# Patient Record
Sex: Female | Born: 1965 | Race: Black or African American | Hispanic: No | Smoking: Never smoker
Health system: Southern US, Community
[De-identification: ages and names within clinical notes are randomized; demographics above are authoritative.]

## PROBLEM LIST (undated history)

## (undated) DIAGNOSIS — S82201B Unspecified fracture of shaft of right tibia, initial encounter for open fracture type I or II: Secondary | ICD-10-CM

## (undated) DIAGNOSIS — R600 Localized edema: Secondary | ICD-10-CM

## (undated) DIAGNOSIS — B20 Human immunodeficiency virus [HIV] disease: Secondary | ICD-10-CM

## (undated) DIAGNOSIS — W3400XA Accidental discharge from unspecified firearms or gun, initial encounter: Secondary | ICD-10-CM

## (undated) DIAGNOSIS — Z21 Asymptomatic human immunodeficiency virus [HIV] infection status: Secondary | ICD-10-CM

## (undated) DIAGNOSIS — M1710 Unilateral primary osteoarthritis, unspecified knee: Secondary | ICD-10-CM

## (undated) DIAGNOSIS — S7400XA Injury of sciatic nerve at hip and thigh level, unspecified leg, initial encounter: Secondary | ICD-10-CM

## (undated) DIAGNOSIS — D649 Anemia, unspecified: Secondary | ICD-10-CM

## (undated) HISTORY — DX: Unilateral primary osteoarthritis, unspecified knee: M17.10

## (undated) HISTORY — DX: Localized edema: R60.0

## (undated) HISTORY — PX: TUBAL LIGATION: SHX77

---

## 1997-06-12 ENCOUNTER — Encounter: Admission: RE | Admit: 1997-06-12 | Discharge: 1997-06-12 | Payer: Self-pay | Admitting: Internal Medicine

## 1997-07-21 ENCOUNTER — Encounter: Admission: RE | Admit: 1997-07-21 | Discharge: 1997-07-21 | Payer: Self-pay | Admitting: Internal Medicine

## 1998-01-11 ENCOUNTER — Encounter: Admission: RE | Admit: 1998-01-11 | Discharge: 1998-01-11 | Payer: Self-pay | Admitting: Internal Medicine

## 1998-02-22 ENCOUNTER — Encounter: Admission: RE | Admit: 1998-02-22 | Discharge: 1998-02-22 | Payer: Self-pay | Admitting: Internal Medicine

## 1998-11-25 ENCOUNTER — Encounter: Payer: Self-pay | Admitting: Obstetrics and Gynecology

## 1998-11-26 ENCOUNTER — Ambulatory Visit (HOSPITAL_COMMUNITY): Admission: RE | Admit: 1998-11-26 | Discharge: 1998-11-26 | Payer: Self-pay | Admitting: Obstetrics and Gynecology

## 2000-08-20 ENCOUNTER — Encounter (INDEPENDENT_AMBULATORY_CARE_PROVIDER_SITE_OTHER): Payer: Self-pay | Admitting: *Deleted

## 2000-08-20 ENCOUNTER — Encounter: Admission: RE | Admit: 2000-08-20 | Discharge: 2000-08-20 | Payer: Self-pay | Admitting: Internal Medicine

## 2000-08-20 ENCOUNTER — Ambulatory Visit (HOSPITAL_COMMUNITY): Admission: RE | Admit: 2000-08-20 | Discharge: 2000-08-20 | Payer: Self-pay | Admitting: Internal Medicine

## 2000-08-20 LAB — CONVERTED CEMR LAB
CD4 Count: 560 microliters
CD4 T Cell Abs: 560

## 2000-09-12 ENCOUNTER — Encounter: Admission: RE | Admit: 2000-09-12 | Discharge: 2000-09-12 | Payer: Self-pay | Admitting: Internal Medicine

## 2001-10-11 ENCOUNTER — Other Ambulatory Visit: Admission: RE | Admit: 2001-10-11 | Discharge: 2001-10-11 | Payer: Self-pay | Admitting: Obstetrics and Gynecology

## 2003-03-03 ENCOUNTER — Encounter (INDEPENDENT_AMBULATORY_CARE_PROVIDER_SITE_OTHER): Payer: Self-pay | Admitting: *Deleted

## 2003-03-03 ENCOUNTER — Encounter: Admission: RE | Admit: 2003-03-03 | Discharge: 2003-03-03 | Payer: Self-pay | Admitting: Internal Medicine

## 2003-03-03 ENCOUNTER — Ambulatory Visit (HOSPITAL_COMMUNITY): Admission: RE | Admit: 2003-03-03 | Discharge: 2003-03-03 | Payer: Self-pay | Admitting: Internal Medicine

## 2003-03-03 LAB — CONVERTED CEMR LAB: HIV 1 RNA Quant: 2190 copies/mL

## 2003-07-08 ENCOUNTER — Encounter: Admission: RE | Admit: 2003-07-08 | Discharge: 2003-07-08 | Payer: Self-pay | Admitting: Internal Medicine

## 2003-07-08 ENCOUNTER — Ambulatory Visit (HOSPITAL_COMMUNITY): Admission: RE | Admit: 2003-07-08 | Discharge: 2003-07-08 | Payer: Self-pay | Admitting: Internal Medicine

## 2003-07-08 ENCOUNTER — Encounter (INDEPENDENT_AMBULATORY_CARE_PROVIDER_SITE_OTHER): Payer: Self-pay | Admitting: *Deleted

## 2003-07-08 LAB — CONVERTED CEMR LAB
CD4 Count: 500 uL
HIV 1 RNA Quant: 487 {copies}/mL

## 2003-07-22 ENCOUNTER — Encounter: Admission: RE | Admit: 2003-07-22 | Discharge: 2003-07-22 | Payer: Self-pay | Admitting: Internal Medicine

## 2004-12-14 ENCOUNTER — Emergency Department (HOSPITAL_COMMUNITY): Admission: EM | Admit: 2004-12-14 | Discharge: 2004-12-14 | Payer: Self-pay | Admitting: Family Medicine

## 2005-06-13 ENCOUNTER — Encounter (INDEPENDENT_AMBULATORY_CARE_PROVIDER_SITE_OTHER): Payer: Self-pay | Admitting: *Deleted

## 2005-06-13 ENCOUNTER — Ambulatory Visit: Payer: Self-pay | Admitting: Internal Medicine

## 2005-06-13 ENCOUNTER — Encounter: Admission: RE | Admit: 2005-06-13 | Discharge: 2005-06-13 | Payer: Self-pay | Admitting: Internal Medicine

## 2005-06-13 LAB — CONVERTED CEMR LAB
CD4 Count: 660 microliters
HIV 1 RNA Quant: 1760 copies/mL

## 2005-06-27 ENCOUNTER — Ambulatory Visit: Payer: Self-pay | Admitting: Internal Medicine

## 2006-03-05 ENCOUNTER — Encounter (INDEPENDENT_AMBULATORY_CARE_PROVIDER_SITE_OTHER): Payer: Self-pay | Admitting: *Deleted

## 2006-03-05 LAB — CONVERTED CEMR LAB: Pap Smear: NORMAL

## 2006-03-18 ENCOUNTER — Encounter (INDEPENDENT_AMBULATORY_CARE_PROVIDER_SITE_OTHER): Payer: Self-pay | Admitting: *Deleted

## 2007-03-04 ENCOUNTER — Encounter: Payer: Self-pay | Admitting: Internal Medicine

## 2007-03-18 ENCOUNTER — Ambulatory Visit: Payer: Self-pay | Admitting: Internal Medicine

## 2007-03-18 ENCOUNTER — Encounter: Admission: RE | Admit: 2007-03-18 | Discharge: 2007-03-18 | Payer: Self-pay | Admitting: Internal Medicine

## 2007-03-18 LAB — CONVERTED CEMR LAB
ALT: 12 units/L (ref 0–35)
AST: 13 units/L (ref 0–37)
Albumin: 3.9 g/dL (ref 3.5–5.2)
Alkaline Phosphatase: 46 units/L (ref 39–117)
BUN: 12 mg/dL (ref 6–23)
Basophils Absolute: 0 10*3/uL (ref 0.0–0.1)
Basophils Relative: 0 % (ref 0–1)
CO2: 22 meq/L (ref 19–32)
Calcium: 8.9 mg/dL (ref 8.4–10.5)
Chloride: 103 meq/L (ref 96–112)
Cholesterol: 169 mg/dL (ref 0–200)
Creatinine, Ser: 0.9 mg/dL (ref 0.40–1.20)
Eosinophils Absolute: 0.1 10*3/uL (ref 0.0–0.7)
Eosinophils Relative: 1 % (ref 0–5)
Glucose, Bld: 92 mg/dL (ref 70–99)
HCT: 38.1 % (ref 36.0–46.0)
HDL: 39 mg/dL — ABNORMAL LOW (ref >39)
HIV 1 RNA Quant: 719 copies/mL — ABNORMAL HIGH (ref <50)
HIV-1 RNA Quant, Log: 2.86 — ABNORMAL HIGH (ref <1.70)
Hemoglobin: 11.7 g/dL — ABNORMAL LOW (ref 12.0–15.0)
LDL Cholesterol: 107 mg/dL — ABNORMAL HIGH (ref 0–99)
Lymphocytes Relative: 57 % — ABNORMAL HIGH (ref 12–46)
Lymphs Abs: 3 10*3/uL (ref 0.7–4.0)
MCHC: 30.7 g/dL (ref 30.0–36.0)
MCV: 92.3 fL (ref 78.0–100.0)
Monocytes Absolute: 0.4 10*3/uL (ref 0.1–1.0)
Monocytes Relative: 8 % (ref 3–12)
Neutro Abs: 1.8 10*3/uL (ref 1.7–7.7)
Neutrophils Relative %: 34 % — ABNORMAL LOW (ref 43–77)
Platelets: 289 10*3/uL (ref 150–400)
Potassium: 4.7 meq/L (ref 3.5–5.3)
RBC: 4.13 M/uL (ref 3.87–5.11)
RDW: 14.1 % (ref 11.5–15.5)
Sodium: 140 meq/L (ref 135–145)
Total Bilirubin: 0.3 mg/dL (ref 0.3–1.2)
Total CHOL/HDL Ratio: 4.3
Total Protein: 7.4 g/dL (ref 6.0–8.3)
Triglycerides: 116 mg/dL (ref <150)
VLDL: 23 mg/dL (ref 0–40)
WBC: 5.2 10*3/uL (ref 4.0–10.5)

## 2007-03-27 ENCOUNTER — Ambulatory Visit: Payer: Self-pay | Admitting: Internal Medicine

## 2007-03-27 DIAGNOSIS — B20 Human immunodeficiency virus [HIV] disease: Secondary | ICD-10-CM | POA: Insufficient documentation

## 2007-06-17 ENCOUNTER — Telehealth (INDEPENDENT_AMBULATORY_CARE_PROVIDER_SITE_OTHER): Payer: Self-pay | Admitting: *Deleted

## 2010-07-06 ENCOUNTER — Inpatient Hospital Stay (INDEPENDENT_AMBULATORY_CARE_PROVIDER_SITE_OTHER)
Admission: RE | Admit: 2010-07-06 | Discharge: 2010-07-06 | Disposition: A | Discharge status: Home / Self Care | Payer: BC Managed Care – PPO | Source: Ambulatory Visit | Attending: Family Medicine | Admitting: Family Medicine

## 2010-07-06 ENCOUNTER — Ambulatory Visit (INDEPENDENT_AMBULATORY_CARE_PROVIDER_SITE_OTHER): Payer: BC Managed Care – PPO

## 2010-07-06 DIAGNOSIS — R6889 Other general symptoms and signs: Secondary | ICD-10-CM

## 2011-09-06 ENCOUNTER — Emergency Department (HOSPITAL_COMMUNITY): Payer: Worker's Compensation

## 2011-09-06 ENCOUNTER — Inpatient Hospital Stay (HOSPITAL_COMMUNITY)
Admission: EM | Admit: 2011-09-06 | Discharge: 2011-09-11 | DRG: 579 | Disposition: A | Discharge status: Rehabilitation Facility | Payer: Worker's Compensation | Attending: Orthopedic Surgery | Admitting: Orthopedic Surgery

## 2011-09-06 ENCOUNTER — Other Ambulatory Visit: Payer: Self-pay

## 2011-09-06 ENCOUNTER — Encounter (HOSPITAL_COMMUNITY): Payer: Self-pay | Admitting: *Deleted

## 2011-09-06 ENCOUNTER — Emergency Department (HOSPITAL_COMMUNITY): Payer: Worker's Compensation | Admitting: *Deleted

## 2011-09-06 ENCOUNTER — Encounter (HOSPITAL_COMMUNITY)
Admission: EM | Disposition: A | Discharge status: Rehabilitation Facility | Payer: Self-pay | Source: Home / Self Care | Attending: Orthopedic Surgery

## 2011-09-06 DIAGNOSIS — M216X9 Other acquired deformities of unspecified foot: Secondary | ICD-10-CM | POA: Diagnosis present

## 2011-09-06 DIAGNOSIS — W3400XA Accidental discharge from unspecified firearms or gun, initial encounter: Secondary | ICD-10-CM

## 2011-09-06 DIAGNOSIS — S82209B Unspecified fracture of shaft of unspecified tibia, initial encounter for open fracture type I or II: Secondary | ICD-10-CM | POA: Diagnosis present

## 2011-09-06 DIAGNOSIS — S82209A Unspecified fracture of shaft of unspecified tibia, initial encounter for closed fracture: Secondary | ICD-10-CM

## 2011-09-06 DIAGNOSIS — R599 Enlarged lymph nodes, unspecified: Secondary | ICD-10-CM | POA: Diagnosis present

## 2011-09-06 DIAGNOSIS — Y990 Civilian activity done for income or pay: Secondary | ICD-10-CM

## 2011-09-06 DIAGNOSIS — S81009A Unspecified open wound, unspecified knee, initial encounter: Secondary | ICD-10-CM | POA: Diagnosis present

## 2011-09-06 DIAGNOSIS — S82201B Unspecified fracture of shaft of right tibia, initial encounter for open fracture type I or II: Secondary | ICD-10-CM | POA: Diagnosis present

## 2011-09-06 DIAGNOSIS — S71009A Unspecified open wound, unspecified hip, initial encounter: Principal | ICD-10-CM | POA: Diagnosis present

## 2011-09-06 DIAGNOSIS — S71109A Unspecified open wound, unspecified thigh, initial encounter: Principal | ICD-10-CM | POA: Diagnosis present

## 2011-09-06 DIAGNOSIS — S82409A Unspecified fracture of shaft of unspecified fibula, initial encounter for closed fracture: Secondary | ICD-10-CM

## 2011-09-06 DIAGNOSIS — S7400XA Injury of sciatic nerve at hip and thigh level, unspecified leg, initial encounter: Secondary | ICD-10-CM | POA: Diagnosis present

## 2011-09-06 DIAGNOSIS — Y9229 Other specified public building as the place of occurrence of the external cause: Secondary | ICD-10-CM

## 2011-09-06 DIAGNOSIS — D649 Anemia, unspecified: Secondary | ICD-10-CM | POA: Diagnosis present

## 2011-09-06 DIAGNOSIS — E669 Obesity, unspecified: Secondary | ICD-10-CM | POA: Diagnosis present

## 2011-09-06 HISTORY — PX: OTHER SURGICAL HISTORY: SHX169

## 2011-09-06 HISTORY — DX: Injury of sciatic nerve at hip and thigh level, unspecified leg, initial encounter: S74.00XA

## 2011-09-06 HISTORY — PX: TIBIA IM NAIL INSERTION: SHX2516

## 2011-09-06 HISTORY — DX: Accidental discharge from unspecified firearms or gun, initial encounter: W34.00XA

## 2011-09-06 HISTORY — PX: I & D EXTREMITY: SHX5045

## 2011-09-06 HISTORY — DX: Anemia, unspecified: D64.9

## 2011-09-06 HISTORY — DX: Unspecified fracture of shaft of right tibia, initial encounter for open fracture type I or II: S82.201B

## 2011-09-06 LAB — POCT I-STAT, CHEM 8
BUN: 12 mg/dL (ref 6–23)
Creatinine, Ser: 1 mg/dL (ref 0.50–1.10)
Glucose, Bld: 144 mg/dL — ABNORMAL HIGH (ref 70–99)
Hemoglobin: 10.2 g/dL — ABNORMAL LOW (ref 12.0–15.0)
Potassium: 3.5 mEq/L (ref 3.5–5.1)
Sodium: 138 mEq/L (ref 135–145)
TCO2: 20 mmol/L (ref 0–100)

## 2011-09-06 LAB — CBC
HCT: 28.1 % — ABNORMAL LOW (ref 36.0–46.0)
Hemoglobin: 8.6 g/dL — ABNORMAL LOW (ref 12.0–15.0)
MCV: 77.2 fL — ABNORMAL LOW (ref 78.0–100.0)
RBC: 3.64 MIL/uL — ABNORMAL LOW (ref 3.87–5.11)
WBC: 8.1 10*3/uL (ref 4.0–10.5)

## 2011-09-06 LAB — COMPREHENSIVE METABOLIC PANEL
Albumin: 3.5 g/dL (ref 3.5–5.2)
BUN: 12 mg/dL (ref 6–23)
Calcium: 9.2 mg/dL (ref 8.4–10.5)
Creatinine, Ser: 0.88 mg/dL (ref 0.50–1.10)
GFR calc Af Amer: >90 mL/min (ref >90)
Glucose, Bld: 145 mg/dL — ABNORMAL HIGH (ref 70–99)
Potassium: 3.5 mEq/L (ref 3.5–5.1)
Total Protein: 8.3 g/dL (ref 6.0–8.3)

## 2011-09-06 LAB — LACTIC ACID, PLASMA: Lactic Acid, Venous: 2.7 mmol/L — ABNORMAL HIGH (ref 0.5–2.2)

## 2011-09-06 SURGERY — INSERTION, INTRAMEDULLARY ROD, TIBIA
Anesthesia: General | Site: Leg Lower | Laterality: Right | Wound class: Contaminated

## 2011-09-06 MED ORDER — PROPOFOL 10 MG/ML IV BOLUS
INTRAVENOUS | Status: DC | PRN
  Administered 2011-09-06: 170 mg via INTRAVENOUS

## 2011-09-06 MED ORDER — SUCCINYLCHOLINE CHLORIDE 20 MG/ML IJ SOLN
INTRAMUSCULAR | Status: DC | PRN
  Administered 2011-09-06: 120 mg via INTRAVENOUS

## 2011-09-06 MED ORDER — LIDOCAINE HCL (CARDIAC) 20 MG/ML IV SOLN
INTRAVENOUS | Status: DC | PRN
  Administered 2011-09-06: 60 mg via INTRAVENOUS

## 2011-09-06 MED ORDER — 0.9 % SODIUM CHLORIDE (POUR BTL) OPTIME
TOPICAL | Status: DC | PRN
  Administered 2011-09-06 (×2): 1000 mL

## 2011-09-06 MED ORDER — FENTANYL CITRATE 0.05 MG/ML IJ SOLN
INTRAMUSCULAR | Status: DC | PRN
  Administered 2011-09-06: 150 ug via INTRAVENOUS

## 2011-09-06 MED ORDER — FENTANYL CITRATE 0.05 MG/ML IJ SOLN
50.0000 ug | Freq: Once | INTRAMUSCULAR | Status: AC
  Administered 2011-09-06: 50 ug via INTRAVENOUS
  Filled 2011-09-06: qty 2

## 2011-09-06 MED ORDER — SODIUM CHLORIDE 0.9 % IR SOLN
Status: DC | PRN
  Administered 2011-09-06: 3000 mL

## 2011-09-06 MED ORDER — SODIUM CHLORIDE 0.9 % IV SOLN
INTRAVENOUS | Status: DC | PRN
  Administered 2011-09-06: 22:00:00 via INTRAVENOUS

## 2011-09-06 MED ORDER — ROCURONIUM BROMIDE 100 MG/10ML IV SOLN
INTRAVENOUS | Status: DC | PRN
  Administered 2011-09-06: 20 mg via INTRAVENOUS

## 2011-09-06 MED ORDER — MIDAZOLAM HCL 5 MG/5ML IJ SOLN
INTRAMUSCULAR | Status: DC | PRN
  Administered 2011-09-06: 2 mg via INTRAVENOUS

## 2011-09-06 MED ORDER — TETANUS-DIPHTH-ACELL PERTUSSIS 5-2.5-18.5 LF-MCG/0.5 IM SUSP
0.5000 mL | Freq: Once | INTRAMUSCULAR | Status: AC
  Administered 2011-09-06: 0.5 mL via INTRAMUSCULAR
  Filled 2011-09-06: qty 0.5

## 2011-09-06 MED ORDER — LACTATED RINGERS IV SOLN
INTRAVENOUS | Status: DC | PRN
  Administered 2011-09-06: 22:00:00 via INTRAVENOUS

## 2011-09-06 MED ORDER — SUFENTANIL CITRATE 50 MCG/ML IV SOLN
INTRAVENOUS | Status: DC | PRN
  Administered 2011-09-06 (×2): 10 ug via INTRAVENOUS
  Administered 2011-09-06: 20 ug via INTRAVENOUS
  Administered 2011-09-06: 10 ug via INTRAVENOUS

## 2011-09-06 MED ORDER — CEFAZOLIN SODIUM-DEXTROSE 2-3 GM-% IV SOLR
2.0000 g | Freq: Once | INTRAVENOUS | Status: AC
  Administered 2011-09-06: 2 g via INTRAVENOUS
  Filled 2011-09-06: qty 50

## 2011-09-06 SURGICAL SUPPLY — 82 items
BANDAGE ELASTIC 4 VELCRO ST LF (GAUZE/BANDAGES/DRESSINGS) ×3 IMPLANT
BANDAGE ELASTIC 6 VELCRO ST LF (GAUZE/BANDAGES/DRESSINGS) ×2 IMPLANT
BANDAGE GAUZE ELAST BULKY 4 IN (GAUZE/BANDAGES/DRESSINGS) ×4 IMPLANT
BIT DRILL 3.8X6 NS (BIT) ×1 IMPLANT
BIT DRILL 4.4 NS (BIT) ×1 IMPLANT
BLADE SURG 10 STRL SS (BLADE) ×2 IMPLANT
BNDG COHESIVE 4X5 TAN STRL (GAUZE/BANDAGES/DRESSINGS) ×1 IMPLANT
BNDG GAUZE STRTCH 6 (GAUZE/BANDAGES/DRESSINGS) ×3 IMPLANT
BRUSH SCRUB DISP (MISCELLANEOUS) ×3 IMPLANT
CLOTH BEACON ORANGE TIMEOUT ST (SAFETY) ×2 IMPLANT
CONT SPECI 4OZ STER CLIK (MISCELLANEOUS) ×1 IMPLANT
COVER MAYO STAND STRL (DRAPES) ×1 IMPLANT
COVER SURGICAL LIGHT HANDLE (MISCELLANEOUS) ×3 IMPLANT
DRAPE C-ARM 42X72 X-RAY (DRAPES) ×2 IMPLANT
DRAPE C-ARMOR (DRAPES) ×3 IMPLANT
DRAPE INCISE IOBAN 66X45 STRL (DRAPES) ×1 IMPLANT
DRAPE ORTHO SPLIT 77X108 STRL (DRAPES) ×4
DRAPE SURG ORHT 6 SPLT 77X108 (DRAPES) ×2 IMPLANT
DRAPE U-SHAPE 47X51 STRL (DRAPES) ×2 IMPLANT
DRSG ADAPTIC 3X8 NADH LF (GAUZE/BANDAGES/DRESSINGS) ×1 IMPLANT
DRSG MEPILEX BORDER 4X12 (GAUZE/BANDAGES/DRESSINGS) ×1 IMPLANT
DRSG MEPILEX BORDER 4X4 (GAUZE/BANDAGES/DRESSINGS) ×4 IMPLANT
DRSG PAD ABDOMINAL 8X10 ST (GAUZE/BANDAGES/DRESSINGS) ×2 IMPLANT
ELECT CAUTERY BLADE 6.4 (BLADE) IMPLANT
ELECT REM PT RETURN 9FT ADLT (ELECTROSURGICAL) ×2
ELECTRODE REM PT RTRN 9FT ADLT (ELECTROSURGICAL) ×1 IMPLANT
EVACUATOR 1/8 PVC DRAIN (DRAIN) IMPLANT
GLOVE BIO SURGEON STRL SZ 6.5 (GLOVE) ×1 IMPLANT
GLOVE BIO SURGEON STRL SZ7 (GLOVE) ×2 IMPLANT
GLOVE BIO SURGEON STRL SZ7.5 (GLOVE) ×2 IMPLANT
GLOVE BIO SURGEON STRL SZ8 (GLOVE) ×2 IMPLANT
GLOVE BIO SURGEON STRL SZ8.5 (GLOVE) ×1 IMPLANT
GLOVE BIOGEL PI IND STRL 7.0 (GLOVE) IMPLANT
GLOVE BIOGEL PI IND STRL 7.5 (GLOVE) ×1 IMPLANT
GLOVE BIOGEL PI IND STRL 8 (GLOVE) ×1 IMPLANT
GLOVE BIOGEL PI IND STRL 8.5 (GLOVE) IMPLANT
GLOVE BIOGEL PI INDICATOR 7.0 (GLOVE) ×2
GLOVE BIOGEL PI INDICATOR 7.5 (GLOVE) ×3
GLOVE BIOGEL PI INDICATOR 8 (GLOVE) ×1
GLOVE BIOGEL PI INDICATOR 8.5 (GLOVE) ×1
GLOVE SURG SS PI 7.5 STRL IVOR (GLOVE) ×1 IMPLANT
GOWN PREVENTION PLUS XLARGE (GOWN DISPOSABLE) ×3 IMPLANT
GOWN STRL NON-REIN LRG LVL3 (GOWN DISPOSABLE) ×6 IMPLANT
GUIDEWIRE BALL NOSE 80CM (WIRE) ×1 IMPLANT
HANDPIECE INTERPULSE COAX TIP (DISPOSABLE) ×2
KIT BASIN OR (CUSTOM PROCEDURE TRAY) ×2 IMPLANT
KIT ROOM TURNOVER OR (KITS) ×2 IMPLANT
MANIFOLD NEPTUNE II (INSTRUMENTS) ×2 IMPLANT
NAIL IM TIBIAL 10X34.5 (Orthopedic Implant) IMPLANT
NS IRRIG 1000ML POUR BTL (IV SOLUTION) ×3 IMPLANT
PACK GENERAL/GYN (CUSTOM PROCEDURE TRAY) ×1 IMPLANT
PACK ORTHO EXTREMITY (CUSTOM PROCEDURE TRAY) ×2 IMPLANT
PAD ARMBOARD 7.5X6 YLW CONV (MISCELLANEOUS) ×4 IMPLANT
PADDING CAST COTTON 6X4 STRL (CAST SUPPLIES) ×1 IMPLANT
SCREW ACECAP 34MM (Screw) ×1 IMPLANT
SCREW ACECAP 44MM (Screw) ×1 IMPLANT
SCREW PROXIMAL DEPUY (Screw) ×4 IMPLANT
SCREW PRXML FT 50X5.5XLCK NS (Screw) IMPLANT
SCREW PRXML FT 65X5.5XNS CORT (Screw) IMPLANT
SET HNDPC FAN SPRY TIP SCT (DISPOSABLE) IMPLANT
SPONGE GAUZE 4X4 12PLY (GAUZE/BANDAGES/DRESSINGS) ×1 IMPLANT
SPONGE LAP 18X18 X RAY DECT (DISPOSABLE) ×3 IMPLANT
STAPLER VISISTAT 35W (STAPLE) ×1 IMPLANT
STOCKINETTE IMPERVIOUS 9X36 MD (GAUZE/BANDAGES/DRESSINGS) ×1 IMPLANT
STRIP CLOSURE SKIN 1/2X4 (GAUZE/BANDAGES/DRESSINGS) ×1 IMPLANT
SUT ETHILON 3 0 PS 1 (SUTURE) ×3 IMPLANT
SUT PDS AB 2-0 CT1 27 (SUTURE) ×2 IMPLANT
SUT PROLENE 3 0 PS 2 (SUTURE) IMPLANT
SUT VIC AB 0 CT1 27 (SUTURE) ×2
SUT VIC AB 0 CT1 27XBRD ANBCTR (SUTURE) IMPLANT
SUT VIC AB 2-0 CT1 27 (SUTURE) ×4
SUT VIC AB 2-0 CT1 TAPERPNT 27 (SUTURE) IMPLANT
SUT VIC AB 2-0 CT3 27 (SUTURE) ×1 IMPLANT
TIBIAL NAIL 10X34.5 (Orthopedic Implant) ×2 IMPLANT
TOWEL OR 17X24 6PK STRL BLUE (TOWEL DISPOSABLE) ×1 IMPLANT
TOWEL OR 17X26 10 PK STRL BLUE (TOWEL DISPOSABLE) ×4 IMPLANT
TRAY FOLEY CATH 14FR (SET/KITS/TRAYS/PACK) ×2 IMPLANT
TUBE ANAEROBIC SPECIMEN COL (MISCELLANEOUS) IMPLANT
TUBE CONNECTING 12X1/4 (SUCTIONS) ×2 IMPLANT
UNDERPAD 30X30 INCONTINENT (UNDERPADS AND DIAPERS) ×3 IMPLANT
WATER STERILE IRR 1000ML POUR (IV SOLUTION) ×1 IMPLANT
YANKAUER SUCT BULB TIP NO VENT (SUCTIONS) ×2 IMPLANT
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
## 2011-09-06 NOTE — ED Notes (Signed)
Report called to OR RN - pt transported to OR via stretcher per tech
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
## 2011-09-06 NOTE — Anesthesia Preprocedure Evaluation (Addendum)
Anesthesia Evaluation  Patient identified by MRN, date of birth, ID band Patient awake    Reviewed: Allergy & Precautions, H&P , NPO status , Patient's Chart, lab work & pertinent test results, reviewed documented beta blocker date and time   Airway Mallampati: I TM Distance: >3 FB Neck ROM: Full    Dental  (+) Teeth Intact and Dental Advisory Given   Pulmonary neg pulmonary ROS,  breath sounds clear to auscultation        Cardiovascular negative cardio ROS  Rhythm:Regular Rate:Normal     Neuro/Psych    GI/Hepatic negative GI ROS, Neg liver ROS,   Endo/Other  negative endocrine ROS  Renal/GU negative Renal ROS     Musculoskeletal   Abdominal   Peds  Hematology negative hematology ROS (+)   Anesthesia Other Findings   Reproductive/Obstetrics                         Anesthesia Physical Anesthesia Plan  ASA: II and Emergent  Anesthesia Plan: General   Post-op Pain Management:    Induction: Rapid sequence and Intravenous  Airway Management Planned: Oral ETT  Additional Equipment:   Intra-op Plan:   Post-operative Plan: Possible Post-op intubation/ventilation  Informed Consent: I have reviewed the patients History and Physical, chart, labs and discussed the procedure including the risks, benefits and alternatives for the proposed anesthesia with the patient or authorized representative who has indicated his/her understanding and acceptance.   Dental advisory given  Plan Discussed with: CRNA, Anesthesiologist and Surgeon  Anesthesia Plan Comments:       Anesthesia Quick Evaluation

## 2011-09-06 NOTE — Progress Notes (Signed)
Chaplain responded to trauma page at 7:15 pm.  Patient asked that her aunt be called, Fulton Mole  [phone]. Patient gave permission for me to tell Aunt of her injury. Chaplain called aunt and reported information.  Within 20 minutes, aunt, uncle and other family arrived in waiting room.  Chaplain escorted them to trauma room area and RN allowed them to go into pt. Room.  After visiting, family members went to consultation room where chaplain offered comfort measures and spiritual conversation.  Before surgery, family returned to pt. Room where uncle led in prayer, then chaplain escorted family to Surgical waiting room.  Will follow.  Rev. [place], [place] [phone]

## 2011-09-07 ENCOUNTER — Emergency Department (HOSPITAL_COMMUNITY): Payer: Worker's Compensation

## 2011-09-07 ENCOUNTER — Inpatient Hospital Stay (HOSPITAL_COMMUNITY): Payer: Worker's Compensation

## 2011-09-07 ENCOUNTER — Encounter (HOSPITAL_COMMUNITY): Payer: Self-pay | Admitting: General Practice

## 2011-09-07 DIAGNOSIS — S7400XA Injury of sciatic nerve at hip and thigh level, unspecified leg, initial encounter: Secondary | ICD-10-CM

## 2011-09-07 DIAGNOSIS — D649 Anemia, unspecified: Secondary | ICD-10-CM

## 2011-09-07 DIAGNOSIS — W3400XA Accidental discharge from unspecified firearms or gun, initial encounter: Secondary | ICD-10-CM

## 2011-09-07 DIAGNOSIS — S82201B Unspecified fracture of shaft of right tibia, initial encounter for open fracture type I or II: Secondary | ICD-10-CM | POA: Diagnosis present

## 2011-09-07 HISTORY — DX: Accidental discharge from unspecified firearms or gun, initial encounter: W34.00XA

## 2011-09-07 HISTORY — DX: Anemia, unspecified: D64.9

## 2011-09-07 HISTORY — DX: Injury of sciatic nerve at hip and thigh level, unspecified leg, initial encounter: S74.00XA

## 2011-09-07 HISTORY — DX: Unspecified fracture of shaft of right tibia, initial encounter for open fracture type I or II: S82.201B

## 2011-09-07 LAB — CBC
HCT: 32.1 % — ABNORMAL LOW (ref 36.0–46.0)
Hemoglobin: 10 g/dL — ABNORMAL LOW (ref 12.0–15.0)
MCH: 24.7 pg — ABNORMAL LOW (ref 26.0–34.0)
MCH: 24.5 pg — ABNORMAL LOW (ref 26.0–34.0)
MCHC: 31.2 g/dL (ref 30.0–36.0)
MCHC: 31 g/dL (ref 30.0–36.0)
MCV: 79.3 fL (ref 78.0–100.0)
MCV: 79.1 fL (ref 78.0–100.0)
Platelets: 275 10*3/uL (ref 150–400)
RBC: 3.59 MIL/uL — ABNORMAL LOW (ref 3.87–5.11)
RDW: 17.1 % — ABNORMAL HIGH (ref 11.5–15.5)

## 2011-09-07 LAB — URINALYSIS, MICROSCOPIC ONLY
Bilirubin Urine: NEGATIVE
Specific Gravity, Urine: 1.015 (ref 1.005–1.030)
Urobilinogen, UA: 0.2 mg/dL (ref 0.0–1.0)
pH: 5.5 (ref 5.0–8.0)

## 2011-09-07 LAB — BASIC METABOLIC PANEL
CO2: 24 mEq/L (ref 19–32)
Calcium: 8 mg/dL — ABNORMAL LOW (ref 8.4–10.5)
Creatinine, Ser: 0.77 mg/dL (ref 0.50–1.10)
GFR calc non Af Amer: >90 mL/min (ref >90)
Sodium: 135 mEq/L (ref 135–145)

## 2011-09-07 MED ORDER — CEFAZOLIN SODIUM 1-5 GM-% IV SOLN
1.0000 g | Freq: Three times a day (TID) | INTRAVENOUS | Status: AC
  Administered 2011-09-07 (×3): 1 g via INTRAVENOUS
  Filled 2011-09-07 (×4): qty 50

## 2011-09-07 MED ORDER — HYDROMORPHONE 0.3 MG/ML IV SOLN
INTRAVENOUS | Status: DC
  Administered 2011-09-07: 3 mg via INTRAVENOUS
  Administered 2011-09-07: 3.2 mg via INTRAVENOUS
  Administered 2011-09-07: 0.9 mg via INTRAVENOUS
  Administered 2011-09-07: 0.3 mg via INTRAVENOUS
  Administered 2011-09-07: 1.39 mg via INTRAVENOUS
  Administered 2011-09-07: 25 mL via INTRAVENOUS
  Administered 2011-09-08: 1.2 mg via INTRAVENOUS
  Administered 2011-09-08: 0.9 mg via INTRAVENOUS
  Administered 2011-09-08: 06:00:00 via INTRAVENOUS
  Filled 2011-09-07 (×3): qty 25

## 2011-09-07 MED ORDER — DIPHENHYDRAMINE HCL 12.5 MG/5ML PO ELIX: 12.5000 mg | ORAL_SOLUTION | Freq: Four times a day (QID) | ORAL | Status: DC | PRN

## 2011-09-07 MED ORDER — OXYCODONE HCL 5 MG PO TABS
5.0000 mg | ORAL_TABLET | ORAL | Status: DC | PRN
  Administered 2011-09-08 – 2011-09-11 (×8): 10 mg via ORAL
  Filled 2011-09-07 (×8): qty 2

## 2011-09-07 MED ORDER — HYDROMORPHONE HCL PF 1 MG/ML IJ SOLN
0.2500 mg | INTRAMUSCULAR | Status: DC | PRN
  Administered 2011-09-07 (×2): 0.5 mg via INTRAVENOUS

## 2011-09-07 MED ORDER — FERROUS SULFATE 325 (65 FE) MG PO TABS
325.0000 mg | ORAL_TABLET | Freq: Three times a day (TID) | ORAL | Status: DC
  Administered 2011-09-07 – 2011-09-11 (×13): 325 mg via ORAL
  Filled 2011-09-07 (×16): qty 1

## 2011-09-07 MED ORDER — DOCUSATE SODIUM 100 MG PO CAPS
100.0000 mg | ORAL_CAPSULE | Freq: Two times a day (BID) | ORAL | Status: DC
  Administered 2011-09-07 – 2011-09-11 (×9): 100 mg via ORAL
  Filled 2011-09-07 (×12): qty 1

## 2011-09-07 MED ORDER — METOCLOPRAMIDE HCL 5 MG/ML IJ SOLN: 5.0000 mg | Freq: Three times a day (TID) | INTRAMUSCULAR | Status: DC | PRN

## 2011-09-07 MED ORDER — ONDANSETRON HCL 4 MG/2ML IJ SOLN
INTRAMUSCULAR | Status: DC | PRN
  Administered 2011-09-07: 4 mg via INTRAVENOUS

## 2011-09-07 MED ORDER — NEOSTIGMINE METHYLSULFATE 1 MG/ML IJ SOLN
INTRAMUSCULAR | Status: DC | PRN
  Administered 2011-09-07: 3 mg via INTRAVENOUS

## 2011-09-07 MED ORDER — HYDROMORPHONE HCL PF 1 MG/ML IJ SOLN
INTRAMUSCULAR | Status: AC
  Filled 2011-09-07: qty 1

## 2011-09-07 MED ORDER — METOCLOPRAMIDE HCL 10 MG PO TABS: 5.0000 mg | ORAL_TABLET | Freq: Three times a day (TID) | ORAL | Status: DC | PRN

## 2011-09-07 MED ORDER — POTASSIUM CHLORIDE IN NACL 20-0.9 MEQ/L-% IV SOLN
INTRAVENOUS | Status: DC
  Administered 2011-09-07 – 2011-09-08 (×2): via INTRAVENOUS
  Filled 2011-09-07 (×2): qty 1000

## 2011-09-07 MED ORDER — TEMAZEPAM 15 MG PO CAPS: 15.0000 mg | ORAL_CAPSULE | Freq: Every evening | ORAL | Status: DC | PRN

## 2011-09-07 MED ORDER — HYDROMORPHONE 0.3 MG/ML IV SOLN
INTRAVENOUS | Status: AC
  Filled 2011-09-07: qty 25

## 2011-09-07 MED ORDER — GLYCOPYRROLATE 0.2 MG/ML IJ SOLN
INTRAMUSCULAR | Status: DC | PRN
  Administered 2011-09-07: 0.4 mg via INTRAVENOUS

## 2011-09-07 MED ORDER — DIPHENHYDRAMINE HCL 12.5 MG/5ML PO ELIX: 12.5000 mg | ORAL_SOLUTION | ORAL | Status: DC | PRN

## 2011-09-07 MED ORDER — POLYETHYLENE GLYCOL 3350 17 G PO PACK
17.0000 g | PACK | Freq: Every day | ORAL | Status: DC
  Administered 2011-09-07 – 2011-09-10 (×4): 17 g via ORAL
  Filled 2011-09-07 (×5): qty 1

## 2011-09-07 MED ORDER — METHOCARBAMOL 500 MG PO TABS
1000.0000 mg | ORAL_TABLET | Freq: Four times a day (QID) | ORAL | Status: DC
  Administered 2011-09-07: 1000 mg via ORAL
  Administered 2011-09-07: 500 mg via ORAL
  Administered 2011-09-07 – 2011-09-08 (×2): 1000 mg via ORAL
  Administered 2011-09-08: 500 mg via ORAL
  Administered 2011-09-08 (×2): 1000 mg via ORAL
  Administered 2011-09-09 (×3): 500 mg via ORAL
  Administered 2011-09-10 – 2011-09-11 (×6): 1000 mg via ORAL
  Filled 2011-09-07 (×10): qty 2
  Filled 2011-09-07: qty 1
  Filled 2011-09-07 (×6): qty 2
  Filled 2011-09-07: qty 1
  Filled 2011-09-07 (×4): qty 2
  Filled 2011-09-07: qty 1

## 2011-09-07 MED ORDER — ONDANSETRON HCL 4 MG/2ML IJ SOLN: 4.0000 mg | Freq: Four times a day (QID) | INTRAMUSCULAR | Status: DC | PRN

## 2011-09-07 MED ORDER — ACETAMINOPHEN 325 MG PO TABS
325.0000 mg | ORAL_TABLET | Freq: Four times a day (QID) | ORAL | Status: DC | PRN
  Administered 2011-09-08 (×2): 650 mg via ORAL
  Filled 2011-09-07 (×2): qty 2

## 2011-09-07 MED ORDER — HYDROMORPHONE HCL PF 1 MG/ML IJ SOLN: 0.5000 mg | INTRAMUSCULAR | Status: DC | PRN

## 2011-09-07 MED ORDER — METHOCARBAMOL 100 MG/ML IJ SOLN
1000.0000 mg | Freq: Four times a day (QID) | INTRAVENOUS | Status: DC
  Filled 2011-09-07 (×20): qty 10

## 2011-09-07 MED ORDER — SODIUM CHLORIDE 0.9 % IJ SOLN: 9.0000 mL | INTRAMUSCULAR | Status: DC | PRN

## 2011-09-07 MED ORDER — DIPHENHYDRAMINE HCL 50 MG/ML IJ SOLN: 12.5000 mg | Freq: Four times a day (QID) | INTRAMUSCULAR | Status: DC | PRN

## 2011-09-07 MED ORDER — ONDANSETRON HCL 4 MG PO TABS: 4.0000 mg | ORAL_TABLET | Freq: Four times a day (QID) | ORAL | Status: DC | PRN

## 2011-09-07 MED ORDER — NALOXONE HCL 0.4 MG/ML IJ SOLN: 0.4000 mg | INTRAMUSCULAR | Status: DC | PRN
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
## 2011-09-07 NOTE — Anesthesia Postprocedure Evaluation (Signed)
  Anesthesia Post-op Note  Patient: Anne Joyce  Procedure(s) Performed: Procedure(s) (LRB): INTRAMEDULLARY (IM) NAIL TIBIAL (Right) IRRIGATION AND DEBRIDEMENT EXTREMITY (Right)  Patient Location: PACU  Anesthesia Type: General  Level of Consciousness: awake  Airway and Oxygen Therapy: Patient Spontanous Breathing  Post-op Pain: mild  Post-op Assessment: Post-op Vital signs reviewed  Post-op Vital Signs: Reviewed  Complications: No apparent anesthesia complications

## 2011-09-07 NOTE — Transfer of Care (Signed)
Immediate Anesthesia Transfer of Care Note  Patient: Anne Joyce  Procedure(s) Performed: Procedure(s) (LRB): INTRAMEDULLARY (IM) NAIL TIBIAL (Right) IRRIGATION AND DEBRIDEMENT EXTREMITY (Right)  Patient Location: PACU  Anesthesia Type: General  Level of Consciousness: awake, alert  and patient cooperative  Airway & Oxygen Therapy: Patient Spontanous Breathing and Patient connected to nasal cannula oxygen  Post-op Assessment: Report given to PACU RN, Post -op Vital signs reviewed and stable and Patient moving all extremities  Post vital signs: Reviewed and stable  Complications: No apparent anesthesia complications

## 2011-09-07 NOTE — Progress Notes (Signed)
Patient only has orthopedic issues.  Will transfer to ortho service.  Anne Joyce. [name], MD, FACS [phone] Trauma Surgeon
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
## 2011-09-08 DIAGNOSIS — G5700 Lesion of sciatic nerve, unspecified lower limb: Secondary | ICD-10-CM

## 2011-09-08 DIAGNOSIS — S82209A Unspecified fracture of shaft of unspecified tibia, initial encounter for closed fracture: Secondary | ICD-10-CM

## 2011-09-08 LAB — CBC
MCH: 24 pg — ABNORMAL LOW (ref 26.0–34.0)
MCV: 80.6 fL (ref 78.0–100.0)
Platelets: 250 10*3/uL (ref 150–400)
RDW: 17.7 % — ABNORMAL HIGH (ref 11.5–15.5)
WBC: 8 10*3/uL (ref 4.0–10.5)

## 2011-09-08 MED ORDER — ASPIRIN 325 MG PO TABS: 325.0000 mg | ORAL_TABLET | Freq: Every day | ORAL | Status: DC

## 2011-09-08 MED ORDER — ACETAMINOPHEN 325 MG PO TABS: 325.0000 mg | ORAL_TABLET | Freq: Four times a day (QID) | ORAL | Status: DC | PRN

## 2011-09-08 MED ORDER — OMEGA-3-ACID ETHYL ESTERS 1 G PO CAPS
1.0000 g | ORAL_CAPSULE | Freq: Every day | ORAL | Status: DC
  Administered 2011-09-08 – 2011-09-11 (×4): 1 g via ORAL
  Filled 2011-09-08 (×4): qty 1

## 2011-09-08 MED ORDER — ADULT MULTIVITAMIN W/MINERALS CH: 1.0000 | ORAL_TABLET | Freq: Every day | ORAL | Status: DC

## 2011-09-08 MED ORDER — ENOXAPARIN SODIUM 40 MG/0.4ML ~~LOC~~ SOLN
40.0000 mg | SUBCUTANEOUS | Status: DC
  Administered 2011-09-08 – 2011-09-11 (×4): 40 mg via SUBCUTANEOUS
  Filled 2011-09-08 (×4): qty 0.4

## 2011-09-08 MED ORDER — ADULT MULTIVITAMIN W/MINERALS CH
1.0000 | ORAL_TABLET | Freq: Every day | ORAL | Status: DC
  Administered 2011-09-08 – 2011-09-11 (×4): 1 via ORAL
  Filled 2011-09-08 (×4): qty 1

## 2011-09-08 MED ORDER — OXYCODONE-ACETAMINOPHEN 5-325 MG PO TABS
1.0000 | ORAL_TABLET | Freq: Four times a day (QID) | ORAL | Status: DC | PRN
  Administered 2011-09-08: 2 via ORAL
  Administered 2011-09-09: 1 via ORAL
  Administered 2011-09-09 – 2011-09-11 (×7): 2 via ORAL
  Filled 2011-09-08 (×9): qty 2

## 2011-09-08 MED ORDER — OMEGA-3 FATTY ACIDS 1000 MG PO CAPS: 1.0000 g | ORAL_CAPSULE | Freq: Every day | ORAL | Status: DC
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
## 2011-09-08 NOTE — Consult Note (Signed)
I have seen and examined the patient. I agree with the findings above as noted on initial consultation previously.  Budd Palmer, MD 09/08/2011 12:44 PM

## 2011-09-08 NOTE — Progress Notes (Signed)
Physical Therapy Treatment Patient Details Name: Anne Joyce MRN: [id number] DOB: 16-Feb-1965 Today's Date: 09/08/2011 Time: 8295-6213 PT Time Calculation (min): 15 min  PT Assessment / Plan / Recommendation Comments on Treatment Session  Pt unable to stand on L LE today.  permanent splint has not yet arrived.     Follow Up Recommendations  Inpatient Rehab;Skilled nursing facility    Barriers to Discharge        Equipment Recommendations  Defer to next venue;Wheelchair (measurements);Rolling walker with 5" wheels    Recommendations for Other Services Rehab consult  Frequency 7X/week   Plan Discharge plan remains appropriate;Frequency remains appropriate    Precautions / Restrictions Precautions Precautions: Fall Required Braces or Orthoses: Other Brace/Splint Restrictions Weight Bearing Restrictions: Yes RLE Weight Bearing: Non weight bearing LLE Weight Bearing: Weight bearing as tolerated   Pertinent Vitals/Pain 7/10 pain in bilateral LEs. Premedicated.     Mobility  Bed Mobility Bed Mobility: Supine to Sit;Sitting - Scoot to Edge of Bed Supine to Sit: 4: Min assist;HOB flat Sitting - Scoot to [place] of Bed: 4: Min guard Details for Bed Mobility Assistance: assist to support RLE Transfers Transfers: Sit to Stand;Stand to Sit;Anterior-Posterior Transfer Sit to Stand: 1: +1 Total assist;From elevated surface;From bed Stand to Sit: 1: +1 Total assist;To bed Stand Pivot Transfers: Not tested (comment) Anterior-Posterior Transfer: 4: Min assist;To lower surface Details for Transfer Assistance: Attempted to stand 3 times from EOB unsuccessfully.  Pt unable to support body wt through L LE with night splints on and bilateral LEs.  Pt insructed in posteiro slide to recliner. Pt able to complete with min assist for R LE and several rest breaks for UE fatigue.   Ambulation/Gait Ambulation/Gait Assistance: Not tested (comment) Wheelchair Mobility Wheelchair Mobility: No      Exercises     PT Diagnosis:    PT Problem List:   PT Treatment Interventions:     PT Goals Acute Rehab PT Goals PT Goal Formulation: With patient Time For Goal Achievement: 09/21/11 Potential to Achieve Goals: Good Pt will go Supine/Side to Sit: with modified independence;with HOB 0 degrees PT Goal: Supine/Side to Sit - Progress: Progressing toward goal Pt will go Sit to Stand: with supervision;with upper extremity assist PT Goal: Sit to Stand - Progress: Not met Pt will Transfer Bed to Chair/Chair to Bed: with supervision PT Transfer Goal: Bed to Chair/Chair to Bed - Progress: Progressing toward goal Pt will Propel Wheelchair: 51 - 150 feet;with supervision PT Goal: Propel Wheelchair - Progress: Not met  Visit Information  Last PT Received On: 09/08/11 Assistance Needed: +1    Subjective Data  Subjective: My leg hurts (right leg) when I put it down.  Patient Stated Goal: none stated   Cognition  Overall Cognitive Status: Appears within functional limits for tasks assessed/performed Arousal/Alertness: Lethargic Orientation Level: Appears intact for tasks assessed Behavior During Session: [facility] for tasks performed    Balance     End of Session PT - End of Session Equipment Utilized During Treatment: Gait belt Activity Tolerance: Patient limited by fatigue;Patient limited by pain Patient left: in chair;with family/visitor present;with call bell/phone within reach Nurse Communication: Mobility status   GP     Gunhild Bautch 09/08/2011, 12:06 PM Mirai Greenwood L. Graycen Degan DPT [phone]

## 2011-09-08 NOTE — Progress Notes (Signed)
Orthopedic Tech Progress Note Patient Details:  Anne Joyce [birth year] [id number] Brace order completed by [employer] vendor Scarlette Slice. Patient ID: Anne Joyce, female   DOB: [birth year], 46 y.o.   MRN: [id number]   Anne Joyce 09/08/2011, 3:17 PM

## 2011-09-08 NOTE — Progress Notes (Signed)
I have seen and examined the patient. I agree with the findings above.  No recovery of motor on the left at this point, though sensory exam remains WNL.  Have discussed with Rehab, appreciate consultation, and currently under review. Anticipate d/c today or tomorrow  Budd Palmer, MD 09/08/2011 12:45 PM

## 2011-09-08 NOTE — Progress Notes (Signed)
Patient will benefit from an inpt rehab admission. We will see pt Monday to assist with disposition. [phone]
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
## 2011-09-08 NOTE — Progress Notes (Signed)
Orthopedic Tech Progress Note Patient Details:  Anne Joyce 1965/06/10 [id number]  Patient ID: Anne Joyce, female   DOB: 27-Jan-1965, 46 y.o.   MRN: [id number]   Anne Joyce 09/08/2011, 2:29 PM CALLED ADVANCED FOR LEFT AFO
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
## 2011-09-08 NOTE — Progress Notes (Signed)
Orthopedic Tech Progress Note Patient Details:  Anne Joyce [birth year] [id number]  Ortho Devices Type of Ortho Device: Ace wrap;Post (short) splint Splint Material: Fiberglass Ortho Device/Splint Location: (R) LE Ortho Device/Splint Interventions: Application Applied overhead frame and trapeze bar.  [name] 09/08/2011, 2:40 PM
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
## 2011-09-09 LAB — CBC
Hemoglobin: 8.6 g/dL — ABNORMAL LOW (ref 12.0–15.0)
MCHC: 30.9 g/dL (ref 30.0–36.0)
RDW: 17.8 % — ABNORMAL HIGH (ref 11.5–15.5)
WBC: 9.2 10*3/uL (ref 4.0–10.5)
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
## 2011-09-09 NOTE — Progress Notes (Signed)
Physical Therapy Treatment Patient Details Name: Anne Joyce MRN: [id number] DOB: 04-Jul-1965 Today's Date: 09/09/2011 Time: 3244-0102 PT Time Calculation (min): 20 min  PT Assessment / Plan / Recommendation Comments on Treatment Session  Pt very pleasant & willing to participate in therapy.  Attempted sit<>stand & SPT transfer today.  Able to complete but did not maintain NWBing throughout.  [name] family very supportive & interactive.  Pt would cont to be a excellent candidate for CIR.      Follow Up Recommendations  Inpatient Rehab;Skilled nursing facility    Barriers to Discharge        Equipment Recommendations  Defer to next venue;Wheelchair (measurements);Rolling walker with 5" wheels    Recommendations for Other Services Rehab consult  Frequency 7X/week   Plan Discharge plan remains appropriate;Frequency remains appropriate    Precautions / Restrictions Precautions Precautions: Fall Required Braces or Orthoses: Other Brace/Splint Other Brace/Splint: L AFO in room but waiting for family to bring tennis shoe in.  Restrictions Weight Bearing Restrictions: Yes RLE Weight Bearing: Non weight bearing LLE Weight Bearing: Weight bearing as tolerated     Pertinent Vitals/Pain 6/10  RLE.  Received pain medication earlier in AM.  Not due for any more at this time.  Pain decreasing at end of session when positioned in Recliner.      Mobility  Bed Mobility Bed Mobility: Supine to Sit;Sitting - Scoot to Edge of Bed Supine to Sit: 4: Min assist Sitting - Scoot to [place] of Bed: 4: Min guard Details for Bed Mobility Assistance: Assist to move RLE to EOB but then pt able to manage RLE EOB>OOB.  Increased time required.  Transfers Transfers: Sit to Stand;Stand to Sit;Stand Pivot Transfers Sit to Stand: 1: +2 Total assist;With upper extremity assist;From elevated surface;From bed;From chair/3-in-1;With armrests Sit to Stand: Patient Percentage: 40% Stand to Sit: With upper  extremity assist;1: +2 Total assist;With armrests;To chair/3-in-1 Stand to Sit: Patient Percentage: 50% Stand Pivot Transfers: 1: +2 Total assist Stand Pivot Transfers: Patient Percentage: 50% Details for Transfer Assistance: Attempted sit<>stand & Stand pivot transfers today due to pt reports pain not as severe as it has been.  [name] family member assisted with transfers.  Difficulty maintaining NWBing RLE at this time.  Max directional cues for sequencing & technique.  Sit<>stand 2x's.   Ambulation/Gait Ambulation/Gait Assistance: Not tested (comment) Wheelchair Mobility Wheelchair Mobility: No           PT Goals Acute Rehab PT Goals Time For Goal Achievement: 09/21/11 Potential to Achieve Goals: Good PT Goal: Supine/Side to Sit - Progress: Progressing toward goal PT Goal: Sit to Stand - Progress: Progressing toward goal PT Transfer Goal: Bed to Chair/Chair to Bed - Progress: Progressing toward goal  Visit Information  Last PT Received On: 09/09/11 Assistance Needed: +2 (sit<>stand & stand-pivot transfers)    Subjective Data      Cognition  Overall Cognitive Status: Appears within functional limits for tasks assessed/performed Arousal/Alertness: Awake/alert Orientation Level: Appears intact for tasks assessed Behavior During Session: [facility] for tasks performed    Balance     End of Session PT - End of Session Equipment Utilized During Treatment: Gait belt Activity Tolerance: Patient tolerated treatment well Patient left: in chair;with family/visitor present;with call bell/phone within reach Nurse Communication: Mobility status    Verdell Face, [place] [phone] 09/09/2011

## 2011-09-10 LAB — TYPE AND SCREEN
ABO/RH(D): O POS
Unit division: 0
Unit division: 0
Unit division: 0

## 2011-09-10 LAB — CBC
MCV: 80.1 fL (ref 78.0–100.0)
Platelets: 279 10*3/uL (ref 150–400)
RBC: 3.66 MIL/uL — ABNORMAL LOW (ref 3.87–5.11)
WBC: 6.4 10*3/uL (ref 4.0–10.5)

## 2011-09-10 NOTE — Progress Notes (Signed)
Physical Therapy Treatment Patient Details Name: Nerine Pulse MRN: [id number] DOB: 11-Jun-1965 Today's Date: 09/10/2011 Time: 4098-1191 PT Time Calculation (min): 22 min  PT Assessment / Plan / Recommendation Comments on Treatment Session  Pt with improved ability to transfer bed>recliner while maintaining NWBing RLE today compared to yesterday's session.  Educated pt on chair pushups to strengthen UE's & encouraged use of LLE as well- pt tolerated exercise well.       Follow Up Recommendations  Inpatient Rehab;Skilled nursing facility    Barriers to Discharge        Equipment Recommendations  Defer to next venue;Wheelchair (measurements);Rolling walker with 5" wheels    Recommendations for Other Services    Frequency 7X/week   Plan Discharge plan remains appropriate;Frequency remains appropriate    Precautions / Restrictions Precautions Precautions: Fall Required Braces or Orthoses: Other Brace/Splint Other Brace/Splint: L AFO  Restrictions RLE Weight Bearing: Non weight bearing LLE Weight Bearing: Weight bearing as tolerated    Pertinent Vitals/Pain 5-6/10  RLE.  Premedicated.      Mobility  Bed Mobility Bed Mobility: Supine to Sit;Sitting - Scoot to Edge of Bed Supine to Sit: 6: Modified independent (Device/Increase time);HOB flat Sitting - Scoot to Edge of Bed: 6: Modified independent (Device/Increase time) Transfers Transfers: Sit to Stand;Stand to [employer] Transfers Sit to Stand: 1: +2 Total assist;With upper extremity assist;From bed Sit to Stand: Patient Percentage: 60% Stand to Sit: 1: +2 Total assist;With upper extremity assist;To chair/3-in-1 Stand to Sit: Patient Percentage: 70% Stand Pivot Transfers: 1: +2 Total assist Stand Pivot Transfers: Patient Percentage: 60% Details for Transfer Assistance: Improved ability to maintain NWBing RLE today.  Cues for increased use of UE's to assist with balance & to off-load weight from LLE.    Ambulation/Gait Ambulation/Gait Assistance: Not tested (comment)    Exercises General Exercises - Upper Extremity Shoulder Flexion: Strengthening;Both;15 reps;Seated;Theraband Theraband Level (Shoulder Flexion): Level 3 (Green) Shoulder ABduction: Both;15 reps;Seated;Theraband Theraband Level (Shoulder Abduction): Level 3 (Green) Elbow Flexion: Both;15 reps;Seated;Theraband Theraband Level (Elbow Flexion): Level 3 (Green) Elbow Extension: Both;15 reps;Seated;Theraband Theraband Level (Elbow Extension): Level 3 (Green) Chair Push Up: Both;15 reps;Seated     PT Goals Acute Rehab PT Goals Time For Goal Achievement: 09/21/11 Potential to Achieve Goals: Good PT Goal: Supine/Side to Sit - Progress: Met PT Goal: Sit to Stand - Progress: Progressing toward goal PT Transfer Goal: Bed to Chair/Chair to Bed - Progress: Progressing toward goal  Visit Information  Last PT Received On: 09/10/11 Assistance Needed: +2    Subjective Data      Cognition  Overall Cognitive Status: Appears within functional limits for tasks assessed/performed Arousal/Alertness: Awake/alert Orientation Level: Appears intact for tasks assessed Behavior During Session: [facility] for tasks performed    Balance     End of Session PT - End of Session Equipment Utilized During Treatment: Gait belt Activity Tolerance: Patient tolerated treatment well Patient left: in chair;with call bell/phone within reach;with family/visitor present Nurse Communication: Mobility status     Verdell Face, [place] [phone] 09/10/2011

## 2011-09-10 NOTE — Progress Notes (Signed)
Occupational Therapy Treatment Patient Details Name: Anne Joyce MRN: [id number] DOB: May 25, 1965 Today's Date: 09/10/2011 Time: 1010-1035 OT Time Calculation (min): 25 min  OT Assessment / Plan / Recommendation Comments on Treatment Session Making good progress. Excellent rehab candidate. Increased WB via LLE today. Increasing independence with ADL. Pt very motivated to return to PLOF. Emphasized importance of wearing B footdrop splints and having them properly positioned. Will work on [facility] tranfer with droparm BSC. Discussed with nsg.    Follow Up Recommendations  Inpatient Rehab    Barriers to Discharge       Equipment Recommendations  Defer to next venue;Wheelchair (measurements);Rolling walker with 5" wheels    Recommendations for Other Services Rehab consult  Frequency Min 3X/week   Plan Discharge plan remains appropriate    Precautions / Restrictions Precautions Precautions: Fall Required Braces or Orthoses: Other Brace/Splint Other Brace/Splint: L AFO  Restrictions RLE Weight Bearing: Non weight bearing LLE Weight Bearing: Weight bearing as tolerated   Pertinent Vitals/Pain 4    ADL  Upper Body Bathing: Simulated;Set up Where Assessed - Upper Body Bathing: Unsupported sitting Lower Body Bathing: Performed;Moderate assistance Where Assessed - Lower Body Bathing: Supine, head of bed up;Rolling right and/or left Upper Body Dressing: Performed;Set up Where Assessed - Upper Body Dressing: Unsupported sitting Lower Body Dressing: Simulated;Maximal assistance Equipment Used: Reacher;Rolling walker Transfers/Ambulation Related to ADLs: total A +2 stand pivot  ADL Comments: Discussed use of hospital bed to increase independence with ADL. Stressed use of rolling in bed to address LB bathing. Discussed use of drop arm bedside commode to increase independence with toileting     OT Diagnosis:    OT Problem List:   OT Treatment Interventions:     OT Goals Acute Rehab OT  Goals OT Goal Formulation: With patient Time For Goal Achievement: 09/22/11 Potential to Achieve Goals: Good ADL Goals Pt Will Perform Lower Body Bathing: with supervision;Sit to stand from chair;with set-up;Sit to stand in shower ADL Goal: Lower Body Bathing - Progress: Progressing toward goals Pt Will Perform Lower Body Dressing: with set-up;with supervision;Sit to stand from bed;Sit to stand from chair ADL Goal: Lower Body Dressing - Progress: Progressing toward goals Pt Will Transfer to Toilet: with min assist;Squat pivot transfer;with cueing (comment type and amount);Drop arm 3-in-1 (goal modified) ADL Goal: Toilet Transfer - Progress: Goal set today Pt Will Perform Toileting - Clothing Manipulation: with min assist;Sitting on 3-in-1 or toilet;with cueing (comment type and amount);Other (comment) (lateral leans) ADL Goal: Toileting - Clothing Manipulation - Progress: Other (comment) (revised`) Additional ADL Goal #1: Pt will verbalize and adhere to RLE NWB status throughout all functional activites. ADL Goal: Additional Goal #1 - Progress: Met  Visit Information  Last OT Received On: 09/10/11 Assistance Needed: +2    Subjective Data      Prior Functioning  Home Living Lives With: Daughter    Cognition  Overall Cognitive Status: Appears within functional limits for tasks assessed/performed Arousal/Alertness: Awake/alert Orientation Level: Appears intact for tasks assessed Behavior During Session: [facility] for tasks performed    Mobility  Shoulder Instructions Bed Mobility Bed Mobility: Supine to Sit;Sitting - Scoot to Edge of Bed Supine to Sit: 5: Supervision Sitting - Scoot to [place] of Bed: 5: Supervision Transfers Transfers: Sit to Stand;Stand to Sit Sit to Stand: 1: +2 Total assist;With upper extremity assist;From bed Sit to Stand: Patient Percentage: 60% Stand to Sit: 1: +2 Total assist;With upper extremity assist;To chair/3-in-1 Stand to Sit: Patient Percentage:  70% Details for Transfer Assistance:  improved participation with sit - stand today.       Exercises  General Exercises - Upper Extremity Shoulder Flexion: Strengthening;Both;15 reps;Seated;Theraband Theraband Level (Shoulder Flexion): Level 3 (Green) Shoulder ABduction: Both;15 reps;Seated;Theraband Theraband Level (Shoulder Abduction): Level 3 (Green) Elbow Flexion: Both;15 reps;Seated;Theraband Theraband Level (Elbow Flexion): Level 3 (Green) Elbow Extension: Both;15 reps;Seated;Theraband Theraband Level (Elbow Extension): Level 3 (Green) Chair Push Up: Both;15 reps;Seated;Standing   Balance     End of Session OT - End of Session Equipment Utilized During Treatment: Gait belt Activity Tolerance: Patient tolerated treatment well Patient left: in chair;with call bell/phone within reach;with family/visitor present Nurse Communication: Mobility status  GO     Chelsa Stout,HILLARY 09/10/2011, 11:03 AM Luisa Dago, OTR/L  [phone] 09/10/2011

## 2011-09-10 NOTE — Progress Notes (Signed)
Subjective: Doing very well.  Ready for transfer.  Pain controlled.   Objective: Vital signs in last 24 hours: Temp:  [98.3 F (36.8 C)-99.3 F (37.4 C)] 98.3 F (36.8 C) (09/01 0652) Pulse Rate:  [88-91] 90  (09/01 0652) Resp:  [18] 18  (09/01 0652) BP: (128-156)/(62-80) 156/80 mmHg (09/01 0652) SpO2:  [94 %-95 %] 95 % (09/01 0652)  Intake/Output from previous day: 08/31 0701 - 09/01 0700 In: 960 [P.O.:960] Out: -  Intake/Output this shift: Total I/O In: 480 [P.O.:480] Out: -    Basename 09/10/11 0634 09/09/11 0501 09/08/11 0600  HGB 9.0* 8.6* 8.8*    Basename 09/10/11 0634 09/09/11 0501  WBC 6.4 9.2  RBC 3.66* 3.47*  HCT 29.3* 27.8*  PLT 279 247   No results found for this basename: NA:2,K:2,CL:2,CO2:2,BUN:2,CREATININE:2,GLUCOSE:2,CALCIUM:2 in the last 72 hours No results found for this basename: LABPT:2,INR:2 in the last 72 hours  Exam:  Right le wounds look good.  Dressing changed by RN.  No signs of infection.    Assessment/Plan: Patient seen by rehab physician and has been ok'ed to for  placement.  RN will see if she can transfer today.     Jeremaine Maraj M 09/10/2011, 11:17 AM
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
## 2011-09-11 ENCOUNTER — Encounter (HOSPITAL_COMMUNITY): Payer: Self-pay | Admitting: Orthopedic Surgery

## 2011-09-11 ENCOUNTER — Inpatient Hospital Stay (HOSPITAL_COMMUNITY)
Admission: RE | Admit: 2011-09-11 | Discharge: 2011-09-25 | DRG: 946 | Disposition: A | Discharge status: Nursing Home (Includes ICF) | Payer: Worker's Compensation | Source: Ambulatory Visit | Attending: Physical Medicine & Rehabilitation | Admitting: Physical Medicine & Rehabilitation

## 2011-09-11 ENCOUNTER — Encounter (HOSPITAL_COMMUNITY): Payer: Self-pay | Admitting: *Deleted

## 2011-09-11 DIAGNOSIS — D649 Anemia, unspecified: Secondary | ICD-10-CM | POA: Diagnosis present

## 2011-09-11 DIAGNOSIS — S8410XA Injury of peroneal nerve at lower leg level, unspecified leg, initial encounter: Secondary | ICD-10-CM

## 2011-09-11 DIAGNOSIS — S7400XA Injury of sciatic nerve at hip and thigh level, unspecified leg, initial encounter: Secondary | ICD-10-CM

## 2011-09-11 DIAGNOSIS — S82201B Unspecified fracture of shaft of right tibia, initial encounter for open fracture type I or II: Secondary | ICD-10-CM | POA: Diagnosis present

## 2011-09-11 DIAGNOSIS — Z79899 Other long term (current) drug therapy: Secondary | ICD-10-CM

## 2011-09-11 DIAGNOSIS — G5700 Lesion of sciatic nerve, unspecified lower limb: Secondary | ICD-10-CM

## 2011-09-11 DIAGNOSIS — M216X9 Other acquired deformities of unspecified foot: Secondary | ICD-10-CM

## 2011-09-11 DIAGNOSIS — Y990 Civilian activity done for income or pay: Secondary | ICD-10-CM

## 2011-09-11 DIAGNOSIS — Z5189 Encounter for other specified aftercare: Principal | ICD-10-CM

## 2011-09-11 DIAGNOSIS — S71109A Unspecified open wound, unspecified thigh, initial encounter: Secondary | ICD-10-CM

## 2011-09-11 DIAGNOSIS — K5900 Constipation, unspecified: Secondary | ICD-10-CM

## 2011-09-11 DIAGNOSIS — Y9269 Other specified industrial and construction area as the place of occurrence of the external cause: Secondary | ICD-10-CM

## 2011-09-11 DIAGNOSIS — S82209B Unspecified fracture of shaft of unspecified tibia, initial encounter for open fracture type I or II: Secondary | ICD-10-CM

## 2011-09-11 DIAGNOSIS — W3400XA Accidental discharge from unspecified firearms or gun, initial encounter: Secondary | ICD-10-CM

## 2011-09-11 DIAGNOSIS — I10 Essential (primary) hypertension: Secondary | ICD-10-CM

## 2011-09-11 DIAGNOSIS — Y9389 Activity, other specified: Secondary | ICD-10-CM

## 2011-09-11 DIAGNOSIS — S71009A Unspecified open wound, unspecified hip, initial encounter: Secondary | ICD-10-CM

## 2011-09-11 DIAGNOSIS — D6489 Other specified anemias: Secondary | ICD-10-CM

## 2011-09-11 LAB — URINALYSIS, ROUTINE W REFLEX MICROSCOPIC
Ketones, ur: 15 mg/dL — AB
Leukocytes, UA: NEGATIVE
Nitrite: NEGATIVE
Protein, ur: NEGATIVE mg/dL

## 2011-09-11 LAB — CBC
HCT: 29.9 % — ABNORMAL LOW (ref 36.0–46.0)
Hemoglobin: 9.1 g/dL — ABNORMAL LOW (ref 12.0–15.0)
WBC: 5.4 10*3/uL (ref 4.0–10.5)

## 2011-09-11 LAB — POCT I-STAT 4, (NA,K, GLUC, HGB,HCT)
Glucose, Bld: 107 mg/dL — ABNORMAL HIGH (ref 70–99)
Hemoglobin: 7.5 g/dL — ABNORMAL LOW (ref 12.0–15.0)
Potassium: 3.8 mEq/L (ref 3.5–5.1)

## 2011-09-11 LAB — URINE MICROSCOPIC-ADD ON

## 2011-09-11 MED ORDER — PROCHLORPERAZINE MALEATE 5 MG PO TABS
5.0000 mg | ORAL_TABLET | Freq: Four times a day (QID) | ORAL | Status: DC | PRN
  Filled 2011-09-11: qty 2

## 2011-09-11 MED ORDER — TRAMADOL HCL 50 MG PO TABS
50.0000 mg | ORAL_TABLET | Freq: Four times a day (QID) | ORAL | Status: DC | PRN
Start: 2011-09-11 — End: 2011-09-25
  Administered 2011-09-14 – 2011-09-20 (×2): 50 mg via ORAL
  Filled 2011-09-11 (×2): qty 1

## 2011-09-11 MED ORDER — PROCHLORPERAZINE EDISYLATE 5 MG/ML IJ SOLN
5.0000 mg | Freq: Four times a day (QID) | INTRAMUSCULAR | Status: DC | PRN
  Filled 2011-09-11: qty 2

## 2011-09-11 MED ORDER — DOCUSATE SODIUM 100 MG PO CAPS
100.0000 mg | ORAL_CAPSULE | Freq: Two times a day (BID) | ORAL | Status: DC
  Administered 2011-09-11 – 2011-09-14 (×6): 100 mg via ORAL
  Filled 2011-09-11 (×8): qty 1

## 2011-09-11 MED ORDER — POLYETHYLENE GLYCOL 3350 17 G PO PACK
17.0000 g | PACK | Freq: Every day | ORAL | Status: DC
  Administered 2011-09-11 – 2011-09-14 (×4): 17 g via ORAL
  Filled 2011-09-11 (×5): qty 1

## 2011-09-11 MED ORDER — TRAZODONE HCL 50 MG PO TABS: 25.0000 mg | ORAL_TABLET | Freq: Every evening | ORAL | Status: DC | PRN

## 2011-09-11 MED ORDER — METHOCARBAMOL 500 MG PO TABS
500.0000 mg | ORAL_TABLET | Freq: Four times a day (QID) | ORAL | Status: DC
  Administered 2011-09-11 – 2011-09-22 (×45): 1000 mg via ORAL
  Administered 2011-09-23 (×2): 500 mg via ORAL
  Administered 2011-09-23: 1000 mg via ORAL
  Administered 2011-09-23: 500 mg via ORAL
  Administered 2011-09-24: 1000 mg via ORAL
  Administered 2011-09-24: 500 mg via ORAL
  Administered 2011-09-24: 1000 mg via ORAL
  Administered 2011-09-24: 500 mg via ORAL
  Administered 2011-09-25 (×2): 1000 mg via ORAL
  Filled 2011-09-11 (×6): qty 2
  Filled 2011-09-11: qty 1
  Filled 2011-09-11 (×45): qty 2
  Filled 2011-09-11: qty 1
  Filled 2011-09-11 (×15): qty 2

## 2011-09-11 MED ORDER — GUAIFENESIN-DM 100-10 MG/5ML PO SYRP: 5.0000 mL | ORAL_SOLUTION | Freq: Four times a day (QID) | ORAL | Status: DC | PRN

## 2011-09-11 MED ORDER — OXYCODONE HCL 5 MG PO TABS
5.0000 mg | ORAL_TABLET | ORAL | Status: DC | PRN
  Administered 2011-09-11 – 2011-09-25 (×49): 10 mg via ORAL
  Filled 2011-09-11 (×51): qty 2

## 2011-09-11 MED ORDER — BISACODYL 10 MG RE SUPP: 10.0000 mg | Freq: Every day | RECTAL | Status: DC | PRN

## 2011-09-11 MED ORDER — ACETAMINOPHEN 325 MG PO TABS: 325.0000 mg | ORAL_TABLET | ORAL | Status: DC | PRN

## 2011-09-11 MED ORDER — OXYCODONE HCL 10 MG PO TB12
20.0000 mg | ORAL_TABLET | Freq: Two times a day (BID) | ORAL | Status: DC
  Administered 2011-09-11 – 2011-09-25 (×28): 20 mg via ORAL
  Filled 2011-09-11 (×28): qty 2

## 2011-09-11 MED ORDER — ADULT MULTIVITAMIN W/MINERALS CH
1.0000 | ORAL_TABLET | Freq: Every day | ORAL | Status: DC
  Administered 2011-09-12 – 2011-09-25 (×14): 1 via ORAL
  Filled 2011-09-11 (×17): qty 1

## 2011-09-11 MED ORDER — ALUM & MAG HYDROXIDE-SIMETH 200-200-20 MG/5ML PO SUSP: 30.0000 mL | ORAL | Status: DC | PRN

## 2011-09-11 MED ORDER — FERROUS SULFATE 325 (65 FE) MG PO TABS
325.0000 mg | ORAL_TABLET | Freq: Three times a day (TID) | ORAL | Status: DC
  Administered 2011-09-11 – 2011-09-25 (×42): 325 mg via ORAL
  Filled 2011-09-11 (×47): qty 1

## 2011-09-11 MED ORDER — DIPHENHYDRAMINE HCL 12.5 MG/5ML PO ELIX
12.5000 mg | ORAL_SOLUTION | ORAL | Status: DC | PRN
  Filled 2011-09-11: qty 10

## 2011-09-11 MED ORDER — OMEGA-3-ACID ETHYL ESTERS 1 G PO CAPS
1.0000 g | ORAL_CAPSULE | Freq: Every day | ORAL | Status: DC
  Administered 2011-09-12 – 2011-09-25 (×14): 1 g via ORAL
  Filled 2011-09-11 (×17): qty 1

## 2011-09-11 MED ORDER — PROCHLORPERAZINE 25 MG RE SUPP
12.5000 mg | Freq: Four times a day (QID) | RECTAL | Status: DC | PRN
  Filled 2011-09-11: qty 1

## 2011-09-11 MED ORDER — FLEET ENEMA 7-19 GM/118ML RE ENEM
1.0000 | ENEMA | Freq: Once | RECTAL | Status: AC | PRN
  Filled 2011-09-11: qty 1

## 2011-09-11 MED ORDER — ENOXAPARIN SODIUM 40 MG/0.4ML ~~LOC~~ SOLN
40.0000 mg | SUBCUTANEOUS | Status: AC
  Administered 2011-09-12 – 2011-09-19 (×8): 40 mg via SUBCUTANEOUS
  Filled 2011-09-11 (×9): qty 0.4
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
## 2011-09-11 NOTE — Plan of Care (Signed)
Overall Plan of Care [facility]) Patient Details Name: Anne Joyce MRN: [id number] DOB: 03-10-1965  Diagnosis:    Primary Diagnosis:    Injury, nerve, sciatic Co-morbidities: pain, wound care  Functional Problem List  Patient demonstrates impairments in the following areas: Balance, Bowel, Edema, Endurance, Medication Management, Motor, Pain and Skin Integrity  Basic ADL's: grooming, bathing, dressing and toileting Advanced ADL's: simple meal preparation  Transfers:  bed mobility, bed to chair, toilet, tub/shower, car and furniture Locomotion:  ambulation and stairs  Additional Impairments:  Leisure Awareness  Anticipated Outcomes Item Anticipated Outcome  Eating/Swallowing    Basic self-care  supervision  Tolieting   supervision  Bowel/Bladder    Transfers  Mod I, supervision car transfers  Locomotion  Mod I household gait; mod I w/c mobility  Communication    Cognition    Pain  3 or less  Safety/Judgment    Other     Therapy Plan: PT Frequency: 1-2 X/day, 60-90 minutes  OT Frequency:  1-2X/day, 60-90 minutes     Team Interventions: Item RN PT OT SLP SW TR Other  Self Care/Advanced ADL Retraining  x x      Neuromuscular Re-Education  x x      Therapeutic Activities  x x   x   UE/LE Strength Training/ROM  x x   x   UE/LE Coordination Activities  x       Visual/Perceptual Remediation/Compensation         DME/Adaptive Equipment Instruction  x x   x   Therapeutic Exercise  x x   x   Balance/Vestibular Training  x x   x   Patient/Family Education x x x   x   Cognitive Remediation/Compensation         Functional Mobility Training  x x   x   Ambulation/Gait Training  x       Stair Training  x       Wheelchair Propulsion/Positioning  x       Functional Tourist information centre manager Reintegration  x x   x   Dysphagia/Aspiration Film/video editor         Bladder Management         Bowel Management x         Disease Management/Prevention x        Pain Management x x x      Medication Management x        Skin Care/Wound Management x x       Splinting/Orthotics x x       Discharge Planning  x x   x   Psychosocial Support  x x   x                      Team Discharge Planning: Destination:  Sister's house Projected Follow-up:  PT and HHPT v OPPT HHOT Projected Equipment Needs:  Water engineer. 3:1, tub bench Patient/family involved in discharge planning:  Yes  MD ELOS: 10 days Medical Rehab Prognosis:  Excellent Assessment: The patient is admitted for CIR therapies. Goals are set at supervision to Mod I. Therapy will be addressing self-care, mobility, orthotics, safety, pain mgt, adaptive equipment, strength.
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
## 2011-09-11 NOTE — Progress Notes (Signed)
Subjective: Doing well.  Waiting for transfer to cir.     Objective: Vital signs in last 24 hours: Temp:  [97.9 F (36.6 C)-98.6 F (37 C)] 97.9 F (36.6 C) (09/02 0537) Pulse Rate:  [77-91] 77  (09/02 0537) Resp:  [16-18] 16  (09/02 0537) BP: (131-143)/(75-77) 143/76 mmHg (09/02 0537) SpO2:  [99 %-100 %] 100 % (09/02 0537)  Intake/Output from previous day: 09/01 0701 - 09/02 0700 In: 1440 [P.O.:1440] Out: -  Intake/Output this shift: Total I/O In: 480 [P.O.:480] Out: -    Basename 09/11/11 0540 09/10/11 0634 09/09/11 0501  HGB 9.1* 9.0* 8.6*    Basename 09/11/11 0540 09/10/11 0634  WBC 5.4 6.4  RBC 3.76* 3.66*  HCT 29.9* 29.3*  PLT 286 279   No results found for this basename: NA:2,K:2,CL:2,CO2:2,BUN:2,CREATININE:2,GLUCOSE:2,CALCIUM:2 in the last 72 hours No results found for this basename: LABPT:2,INR:2 in the last 72 hours  Exam.  Dressings cdi.    Assessment/Plan: Anticipate transfer today.  Continue present care.  Dr handy to follow in AM.   Zonia Kief M 09/11/2011, 11:58 AM

## 2011-09-11 NOTE — Progress Notes (Signed)
Pt arrived to unit at 1410. Daughter at bedside. Reviewed rehab process and safety plan with pt and daughter. Pt signed safety plan. No further questions at this time. Belongings with pt at bedside. Call bell within reach.
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
## 2011-09-12 ENCOUNTER — Inpatient Hospital Stay (HOSPITAL_COMMUNITY): Payer: Self-pay | Admitting: *Deleted

## 2011-09-12 ENCOUNTER — Inpatient Hospital Stay (HOSPITAL_COMMUNITY): Payer: BC Managed Care – PPO

## 2011-09-12 ENCOUNTER — Inpatient Hospital Stay (HOSPITAL_COMMUNITY): Payer: Self-pay | Admitting: Physical Therapy

## 2011-09-12 ENCOUNTER — Inpatient Hospital Stay (HOSPITAL_COMMUNITY): Payer: BC Managed Care – PPO | Admitting: Occupational Therapy

## 2011-09-12 DIAGNOSIS — G5700 Lesion of sciatic nerve, unspecified lower limb: Secondary | ICD-10-CM

## 2011-09-12 DIAGNOSIS — Z5189 Encounter for other specified aftercare: Secondary | ICD-10-CM

## 2011-09-12 LAB — CBC WITH DIFFERENTIAL/PLATELET
Eosinophils Relative: 4 % (ref 0–5)
HCT: 29.7 % — ABNORMAL LOW (ref 36.0–46.0)
Lymphocytes Relative: 32 % (ref 12–46)
Lymphs Abs: 2 10*3/uL (ref 0.7–4.0)
MCV: 79.4 fL (ref 78.0–100.0)
Platelets: 342 10*3/uL (ref 150–400)
RBC: 3.74 MIL/uL — ABNORMAL LOW (ref 3.87–5.11)
WBC: 6.2 10*3/uL (ref 4.0–10.5)

## 2011-09-12 LAB — COMPREHENSIVE METABOLIC PANEL
ALT: 13 U/L (ref 0–35)
Alkaline Phosphatase: 52 U/L (ref 39–117)
CO2: 29 mEq/L (ref 19–32)
Calcium: 9.1 mg/dL (ref 8.4–10.5)
Chloride: 101 mEq/L (ref 96–112)
GFR calc Af Amer: >90 mL/min (ref >90)
GFR calc non Af Amer: >90 mL/min (ref >90)
Glucose, Bld: 110 mg/dL — ABNORMAL HIGH (ref 70–99)
Sodium: 137 mEq/L (ref 135–145)
Total Bilirubin: 0.2 mg/dL — ABNORMAL LOW (ref 0.3–1.2)
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
## 2011-09-12 NOTE — Progress Notes (Signed)
Physical Therapy Session Note  Patient Details  Name: Anne Joyce MRN: [id number] Date of Birth: 12/20/1965  Today's Date: 09/12/2011 Time: 4098-1191 Time Calculation (min): 25 min  Short Term Goals: Week 1:  PT Short Term Goal 1 (Week 1): = LTGs  Skilled Therapeutic Interventions/Progress Updates:    Initiated a bil. LE exercise/ROM program. Program includes: ankle dorsiflexion/plantarflexion (assist with sheet as needed); heel slides (assist with sheet as needed), hip abduction/adduction, short arc quads, and quad and glute sets. Each exercise performed at least x 10 reps. Pt given handout.   Educated pt on keeping Rt. LE in extension, positioning of pillows to promote Rt. Knee extension per MD orders.  Therapy Documentation Precautions:  Precautions Precautions: Fall Required Braces or Orthoses: Other Brace/Splint Other Brace/Splint: LAFO Restrictions Weight Bearing Restrictions: Yes RLE Weight Bearing: Non weight bearing LLE Weight Bearing: Weight bearing as tolerated Pain: Pain Assessment Pain Assessment: 0-10 Pain Score:   3 Pain Type: Acute pain Pain Location: Leg Pain Orientation: Right Pain Descriptors: Aching Pain Onset: On-going Pain Intervention(s): Repositioned (declined medication)  See FIM for current functional status  Therapy/Group: Individual Therapy  Wilhemina Bonito 09/12/2011, 5:49 PM

## 2011-09-12 NOTE — Progress Notes (Signed)
Physical Therapy Session Note  Patient Details  Name: Anne Joyce MRN: [id number] Date of Birth: 1965/12/31  Today's Date: 09/12/2011 Time: 8295-6213 Time Calculation (min): 42 min  Short Term Goals: Week 1:  PT Short Term Goal 1 (Week 1): = LTGs  Skilled Therapeutic Interventions/Progress Updates:    Focused on functional transfers, managing w/c parts for set up for transfers, gait training with RW with focus on depressing through shoulders for foot clearance, w/c propulsion and push ups for functional strengthening/endurance, and sit to stands. Pt demonstrates improvement from this AM with basic transfers with steady A/close supervision for sit to stand and able to maintain WB status without cueing. Discussed goals with pt and pt in agreement.  Therapy Documentation Precautions:  Precautions Precautions: Fall Required Braces or Orthoses: Other Brace/Splint Other Brace/Splint: LAFO Restrictions Weight Bearing Restrictions: Yes RLE Weight Bearing: Non weight bearing LLE Weight Bearing: Weight bearing as tolerated  Pain: Reports pain as "fine." Received medication prior  See FIM for current functional status  Therapy/Group: Individual Therapy  Karolee Stamps [place] 09/12/2011, 4:41 PM
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
## 2011-09-12 NOTE — Progress Notes (Signed)
Patient information reviewed and entered into [facility]PRO system by Jariya Reichow, RN, CRRN, PPS Coordinator.  Information including medical coding and functional independence measure will be reviewed and updated through discharge.    
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
## 2011-09-13 ENCOUNTER — Encounter (HOSPITAL_COMMUNITY): Payer: Self-pay | Admitting: Occupational Therapy

## 2011-09-13 ENCOUNTER — Inpatient Hospital Stay (HOSPITAL_COMMUNITY): Payer: Self-pay

## 2011-09-13 ENCOUNTER — Inpatient Hospital Stay (HOSPITAL_COMMUNITY): Payer: BC Managed Care – PPO

## 2011-09-13 ENCOUNTER — Inpatient Hospital Stay (HOSPITAL_COMMUNITY): Payer: Self-pay | Admitting: Occupational Therapy

## 2011-09-13 LAB — URINE CULTURE: Colony Count: 50000
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
## 2011-09-13 NOTE — Progress Notes (Signed)
Occupational Therapy Note  Patient Details  Name: Anne Joyce MRN: [id number] Date of Birth: 1965-05-09 Today's Date: 09/13/2011  Time: 1300-1330 Pt c/o 2/10 pain in right leg; "it is much better than it was"; repositioned Individual therapy  Pt in bed resting upon arrival and agreeable to participating in therapy.  Pt performed supine to sit with supervision and stand pivot transfer using RW to w/c with steady assist.  Pt maintains NWB on RLE.  Pt rolled to gym to engage in BUE exercises with weight bar ( 5# and 3#0 and PNF exercises with weighted ball (2 kg).  Pt rolled back to room and transferred to bed.  Focus on activity tolerance and increased BUE strength to increase independence with transfers and BADLs.  Lavone Neri [facility] 09/13/2011, 2:50 PM

## 2011-09-13 NOTE — Progress Notes (Signed)
Social Work Patient ID: Anne Joyce, female   DOB: 1965/09/23, 46 y.o.   MRN: [id number]  Met with patient this morning to review team conference - agreeable with target d/c 9/12 with supervision to modified independent goals.  Notes she is discussing d/c plan with her sister - likely to d/c to sister's home which is more accessible.  Will follow up with worker's comp CM and discuss option of any CNA support at home in addition to follow up therapy and then discuss further with pt and sister.  Jamarkis Branam
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
## 2011-09-13 NOTE — Progress Notes (Signed)
Physical Therapy Session Note  Patient Details  Name: Anne Joyce MRN: [id number] Date of Birth: 03/03/1965  Today's Date: 09/13/2011 Time:  [phone] (45 minutes)    Short Term Goals: Week 1:  PT Short Term Goal 1 (Week 1): = LTGs  Skilled Therapeutic Interventions/Progress Updates:    Pt with reports of increased fatigue today, stating getting up to go to the bathroom at 4am wore her out for the morning. Discussed using BSC next to bed for night time to decrease having to get up and walk/conserve energy at night. W/c propulsion on unit for endurance and strengthening through UEs with supervision, cueing for turns in tight spaces. Simulated car transfer with min A from sedan height using RW. APT bed transfer training which pt reports is similar height to her bed, and practiced short distance gait on carpet with overall min A to simulate household environment. Bed mobility mod I. Reviewed ankle pumps, calf stretch with towel sheet and heel slides (sheet to assist with RLE) from HEP but did not get through the rest. Recommended to pt that she complete the other exercises this afternoon when she has a break and pt in agreement.  Therapy Documentation Precautions:  Precautions Precautions: Fall Required Braces or Orthoses: Other Brace/Splint Other Brace/Splint: LAFO Restrictions Weight Bearing Restrictions: Yes RLE Weight Bearing: Non weight bearing LLE Weight Bearing: Weight bearing as tolerated Pain: Pain Assessment Pain Score:   7 Pain Type: Acute pain Pain Location: Leg Pain Orientation: Right Pain Descriptors: Aching Pain Onset: Gradual Pain Intervention(s): Medication (See eMAR) Premedicated prior to therapy session.   Locomotion : Ambulation Ambulation/Gait Assistance: 4: Min assist   See FIM for current functional status  Therapy/Group: Individual Therapy  Karolee Stamps [facility] 09/13/2011, 9:51 AM

## 2011-09-13 NOTE — Progress Notes (Signed)
Occupational Therapy Session Note  Patient Details  Name: Anne Joyce MRN: [id number] Date of Birth: [birth year]  Today's Date: 09/13/2011 Time: 0801-0859 Time Calculation (min): 58 min  Second Session:  Time:  11:03-11:33 Time Calculation (min):  30 mins  Short Term Goals: Week 1:  OT Short Term Goal 1 (Week 1): Pt will perform LB bathing sit to stand with min assist 2/3 sessions. OT Short Term Goal 2 (Week 1): Pt will perform LB dressing sit to stand with AE PRN and min assist 2/3 sessions. OT Short Term Goal 3 (Week 1): Pt will perform toiltet transfers with min assist using RW and 3:1. OT Short Term Goal 4 (Week 1): Pt will perform toileting with min assist sit to stand.  Skilled Therapeutic Interventions/Progress Updates:    First Session:  Bathing and dressing sit to stand at the sink.  Able to perform sit to stand with min assist and mod instructional cueing for technique.  Introduced AE for [employer].  Pt able to use the reacher to remove the left sock and used the sockaide for donning socks.  Did not need reacher for donning pants or underpants however.  Able to donn left shoe and AFO with reacher to hold the tongue of the shoe only and using the brace as a shoe horn.  Pt still with difficulty sit to stand secondary to not positioning the LLE under her far enough or shifting her weight forward.  Second Session:  Worked on sit to stand transitions from EOB, changing height of bed.  Pt able to perform much better this session compared to earlier session.  Able to stand from lowered bed with only min assist maintaining RLE NWBing status.  Transferred to wheelchair to attempt as well and she was able to perform with min assist.  Improved positioning of the LLE and greater forward trunk flexion/ weight shift noted.  Practiced ambulating short distance with the RW for toilet transfer to 3:1 over the regular toilet.  Min assist for transfer as well as Acupuncturist.  Therapy  Documentation Precautions:  Precautions Precautions: Fall Required Braces or Orthoses: Other Brace/Splint Other Brace/Splint: LAFO Restrictions Weight Bearing Restrictions: Yes RLE Weight Bearing: Non weight bearing LLE Weight Bearing: Weight bearing as tolerated  Pain: Pain Assessment Pain Assessment: 0-10 Pain Score:   7 Pain Type: Acute pain Pain Location: Leg Pain Orientation: Right Pain Descriptors: Aching Pain Onset: Gradual Pain Intervention(s): Medication (See eMAR) Multiple Pain Sites: No  ADL: See FIM for current functional status  Therapy/Group: Individual Therapy  Estiben Mizuno OTR/L 09/13/2011, 11:50 AM
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
## 2011-09-13 NOTE — Plan of Care (Signed)
Problem: RH BOWEL ELIMINATION Goal: RH STG MANAGE BOWEL WITH ASSISTANCE STG Manage Bowel with min Assistance.  Outcome: Not Progressing Patient last bowel movement 09-10-11, suppository offered, patient refused Goal: RH STG MANAGE BOWEL W/MEDICATION W/ASSISTANCE STG Manage Bowel with Medication with min Assistance.  Outcome: Not Progressing Patient last bowel movement 09-10-11, suppository offered, patient refused

## 2011-09-13 NOTE — Progress Notes (Signed)
Patient last bowel movement was 09-10-11, Patient refused suppository warm apple juice given.

## 2011-09-14 ENCOUNTER — Inpatient Hospital Stay (HOSPITAL_COMMUNITY): Payer: Worker's Compensation

## 2011-09-14 ENCOUNTER — Inpatient Hospital Stay (HOSPITAL_COMMUNITY): Payer: Worker's Compensation | Admitting: Physical Therapy

## 2011-09-14 ENCOUNTER — Inpatient Hospital Stay (HOSPITAL_COMMUNITY): Payer: Worker's Compensation | Admitting: Occupational Therapy

## 2011-09-14 MED ORDER — WHITE PETROLATUM GEL
Status: AC
  Administered 2011-09-14: 08:00:00
  Filled 2011-09-14: qty 5

## 2011-09-14 MED ORDER — POLYETHYLENE GLYCOL 3350 17 G PO PACK
17.0000 g | PACK | Freq: Two times a day (BID) | ORAL | Status: DC
  Administered 2011-09-14 – 2011-09-25 (×22): 17 g via ORAL
  Filled 2011-09-14 (×24): qty 1

## 2011-09-14 NOTE — Progress Notes (Signed)
Physical Therapy Session Note  Patient Details  Name: Akeiba Axelson MRN: [id number] Date of Birth: Sep 29, 1965  Today's Date: 09/14/2011 Time: 4098-1191 Time Calculation (min): 43 min  Short Term Goals: Week 1:  PT Short Term Goal 1 (Week 1): = LTGs  Skilled Therapeutic Interventions/Progress Updates:    DYnamic standing balance and standing tolerance activity to play Wii Bowling with seated rest breaks as needed steady A to S with balance. Standing tolerance increased with repetition and sit to stands progressed from min A to supervision. Basic transfers with steady A/close supervision with RW while maintaining WB restrictions without cueing. W/c propulsion on unit for endurance and strengthening. Focused on w/c parts management for all transfers, cues still needed.  Therapy Documentation Precautions:  Precautions Precautions: Fall Required Braces or Orthoses: Other Brace/Splint Other Brace/Splint: LAFO Restrictions Weight Bearing Restrictions: Yes RLE Weight Bearing: Non weight bearing LLE Weight Bearing: Weight bearing as tolerated   Pain: Premedicated for pain, c/o of pain in LEs.  See FIM for current functional status  Therapy/Group: Individual Therapy  Karolee Stamps [facility] 09/14/2011, 12:01 PM

## 2011-09-14 NOTE — Progress Notes (Signed)
Occupational Therapy Session Note  Patient Details  Name: Anne Joyce MRN: [id number] Date of Birth: 01-Mar-1965  Today's Date: 09/14/2011 Time: 0900-1000 Time Calculation (min): 60 min  Short Term Goals: Week 1:  OT Short Term Goal 1 (Week 1): Pt will perform LB bathing sit to stand with min assist 2/3 sessions. OT Short Term Goal 2 (Week 1): Pt will perform LB dressing sit to stand with AE PRN and min assist 2/3 sessions. OT Short Term Goal 3 (Week 1): Pt will perform toiltet transfers with min assist using RW and 3:1. OT Short Term Goal 4 (Week 1): Pt will perform toileting with min assist sit to stand.  Skilled Therapeutic Interventions/Progress Updates:  Upon entering room patient found supine in bed. Engaged in bed mobility for edge of bed -> w/c scoot transfer with steady assist. Patient then performed ADL retraining at sink level in sit->stand position. After ADL patient propelled self -> tub room for simulated tub/shower transfer on/off tub transfer bench. After shower transfer patient propelled self -> therapy gym for bilateral UE strengthening exercises using SCIFIT machine and dumbbells. At end of session patient propelled self back to room and therapist left patient seated in w/c with call bell & phone within reach.   Precautions:  Precautions Precautions: Fall Required Braces or Orthoses: Other Brace/Splint Other Brace/Splint: LAFO Restrictions Weight Bearing Restrictions: Yes RLE Weight Bearing: Non weight bearing LLE Weight Bearing: Weight bearing as tolerated  See FIM for current functional status  Therapy/Group: Individual Therapy  Candi Profit 09/14/2011, 10:53 AM
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
## 2011-09-14 NOTE — Progress Notes (Signed)
Physical Therapy Session Note  Patient Details  Name: Anne Joyce MRN: [id number] Date of Birth: 03/13/1965  Today's Date: 09/14/2011 Time: 8119-1478 Time Calculation (min): 43 min  Short Term Goals: Week 1:  PT Short Term Goal 1 (Week 1): = LTGs  Skilled Therapeutic Interventions/Progress Updates:    Stand pivot transfers working on wheelchair positioning, parts management performed with min assist/supervision. Pt tends to keep wheelchair too far from transfer surface. Bil. UE press up with handles to work on periscapular strengthening for improved foot clearance with gait and transfers. Bicep curls and pressups with 4# weight for generalized bil. UE strengthening per pt request. Gait training x 11', 13' with RW, min-guard assist. Verbal cues for increased bil. LE clearance, pt fatigues quickly. Pt taught wheelchair push ups for UE strengthening per request, pt to perform x 5 reps every TV show.   Therapy Documentation Precautions:  Precautions Precautions: Fall Required Braces or Orthoses: Other Brace/Splint Other Brace/Splint: LAFO Restrictions Weight Bearing Restrictions: Yes RLE Weight Bearing: Non weight bearing LLE Weight Bearing: Weight bearing as tolerated Pain: Pain Assessment Pain Score:   3 (pt resting) Pain Type: Acute pain Pain Location: Leg Pain Orientation: Right Pain Intervention(s): Medication (See eMAR)  See FIM for current functional status  Therapy/Group: Individual Therapy  Wilhemina Bonito 09/14/2011, 6:15 PM

## 2011-09-14 NOTE — Progress Notes (Signed)
[facility] Individual Statement of Services  Patient Name:  Anne Joyce  Date:  09/14/2011  Welcome to the [facility].  Our goal is to provide you with an individualized program based on your diagnosis and situation, designed to meet your specific needs.  With this comprehensive rehabilitation program, you will be expected to participate in at least 3 hours of rehabilitation therapies Monday-Friday, with modified therapy programming on the weekends.  Your rehabilitation program will include the following services:  Physical Therapy (PT), Occupational Therapy (OT), 24 hour per day rehabilitation nursing, Therapeutic Recreaction (TR), Psychology, Case Management ( Social Worker), Rehabilitation Medicine, Nutrition Services and Pharmacy Services  Weekly team conferences will be held on Tuesdays to discuss your progress.  Your  Social Worker will talk with you frequently to get your input and to update you on team discussions.  Team conferences with you and your family in attendance may also be held.  Expected length of stay: 10 days  Overall anticipated outcome: modified independent  Depending on your progress and recovery, your program may change.  Your Social Worker will coordinate services and will keep you informed of any changes.  Your  Social Worker's  name and contact numbers are listed  below.  The following services may also be recommended but are not provided by the [facility]:   Driving Evaluations  Home Health Rehabiltiation Services  Outpatient Rehabilitatation [facility]  Vocational Rehabilitation   Arrangements will be made to provide these services after discharge if needed.  Arrangements include referral to agencies that provide these services.  Your insurance has been verified to be:  Worker's Comp Your primary doctor is:    Pertinent information will be shared with your doctor and your insurance  company.    Social Worker:  Olivia Lopez de Gutierrez, [place] [phone] or ([phone]  Information discussed with and copy given to patient by: Amada Jupiter, 09/14/2011, 9:26 AM

## 2011-09-14 NOTE — Progress Notes (Signed)
Physical Therapy Session Note  Patient Details  Name: Anne Joyce MRN: [id number] Date of Birth: [birth year]  Today's Date: 09/14/2011 Time: 1478-2956 Time Calculation (min): 46 min  Short Term Goals: Week 1:  PT Short Term Goal 1 (Week 1): = LTGs  LE therex for functional strengthening and ROM in bilateral LEs including ankle pumps, calf stretch with sheet, SAQ, heel slides (AAROM on RLE), hip abduction and glut/quad sets all 10 reps x 3 sets bilaterally. Transfers to [facility] and standing balance for clothing management with min A, increased time needed for first transfer with a few attempts before pt successful. Seated EOB for breakfast at end of session.     Therapy Documentation Precautions:  Precautions Precautions: Fall Required Braces or Orthoses: Other Brace/Splint Other Brace/Splint: LAFO Restrictions Weight Bearing Restrictions: Yes RLE Weight Bearing: Non weight bearing LLE Weight Bearing: Weight bearing as tolerated   Pain: Pain Assessment Pain Score:   65/10 in R leg. Premedicated.  See FIM for current functional status  Therapy/Group: Individual Therapy  Karolee Stamps [facility] 09/14/2011, 8:24 AM

## 2011-09-15 ENCOUNTER — Inpatient Hospital Stay (HOSPITAL_COMMUNITY): Payer: Worker's Compensation | Admitting: Occupational Therapy

## 2011-09-15 ENCOUNTER — Inpatient Hospital Stay (HOSPITAL_COMMUNITY): Payer: Worker's Compensation | Admitting: Physical Therapy

## 2011-09-15 ENCOUNTER — Inpatient Hospital Stay (HOSPITAL_COMMUNITY): Payer: Self-pay

## 2011-09-15 DIAGNOSIS — G5700 Lesion of sciatic nerve, unspecified lower limb: Secondary | ICD-10-CM

## 2011-09-15 DIAGNOSIS — Z5189 Encounter for other specified aftercare: Secondary | ICD-10-CM

## 2011-09-15 NOTE — Progress Notes (Signed)
Physical Therapy Note  Patient Details  Name: Anne Joyce MRN: [id number] Date of Birth: 04-14-1965 Today's Date: 09/15/2011  11:40-12:03 individual therapy 4/10 pain in rt. Leg. Pt has been premedicated.  Pt missed 40 minutes of therapy per her request to take care of personal business. performed squat transfer to left with supervision with vc for wc set up and hand placement. Performed wc mobility 2 x 150' supervision with vc for technique to aid shoulder preservation, car transfer with rw minguard assist with vc for hand placement.   Julian Reil 09/15/2011, 12:16 PM
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
## 2011-09-15 NOTE — Progress Notes (Signed)
Occupational Therapy Session Note  Patient Details  Name: Anne Joyce MRN: [id number] Date of Birth: Oct 01, 1965  Today's Date: 09/15/2011 Time: 7829-5621 Time Calculation (min): 60 min  Short Term Goals: Week 1:  OT Short Term Goal 1 (Week 1): Pt will perform LB bathing sit to stand with min assist 2/3 sessions. OT Short Term Goal 2 (Week 1): Pt will perform LB dressing sit to stand with AE PRN and min assist 2/3 sessions. OT Short Term Goal 3 (Week 1): Pt will perform toiltet transfers with min assist using RW and 3:1. OT Short Term Goal 4 (Week 1): Pt will perform toileting with min assist sit to stand.  Skilled Therapeutic Interventions/Progress Updates:  Patient found supine in bed with minimal pain and request for pain medication; RN made aware. Engaged in bed mobility for edge of bed -> w/c stand pivot transfer with AFO donned -> LLE. Therapist then propelled patient to ADL apartment for ADL retraining at shower level using tub transfer bench in tub/shower unit. Focused skilled intervention on overall activity tolerance/endurance, UB/LB bathing & dressing, safety with transfers, safety with rolling walker, and static & dynamic standing balance/tolerance/endurance. Patient propelled self back to room for grooming tasks seated at sink and got back into bed at end of session; left patient with call bell & phone within reach.   Precautions:  Precautions Precautions: Fall Required Braces or Orthoses: Other Brace/Splint Other Brace/Splint: LAFO Restrictions Weight Bearing Restrictions: Yes RLE Weight Bearing: Non weight bearing LLE Weight Bearing: Weight bearing as tolerated  See FIM for current functional status  Therapy/Group: Individual Therapy  Adrijana Haros 09/15/2011, 12:17 PM

## 2011-09-15 NOTE — Progress Notes (Addendum)
Physical Therapy Session Note  Patient Details  Name: Rosebud Koenen MRN: [id number] Date of Birth: [birth year]  Today's Date: 09/15/2011 Time: 1445-1530 Time Calculation (min): 45 min  Short Term Goals: Week 1:  PT Short Term Goal 1 (Week 1): = LTGs  Skilled Therapeutic Interventions/Progress Updates:    Therapeutic exercise performed with bil LE to increase strength for functional mobility:   10 x 1 each- L unilateral bridging, bil bridging through thighs only with bolsters under thighs, modified abdominal crunches, L knee extension with isometric contraction at end range; L ankle circles each direction, active assistive R heel slides, L and R hip abduction/adduction with knee flexion off side of bed for hip flexor stretch;sidelying R and L hip flexion/extension with flexed knee.  PROM L hamstrings, L heel cord.      Therapy Documentation Precautions:  Precautions Precautions: Fall Required Braces or Orthoses: Other Brace/Splint Other Brace/Splint: L AFO during wt bearing Restrictions Weight Bearing Restrictions: Yes RLE Weight Bearing: Non weight bearing LLE Weight Bearing: Weight bearing as tolerated   Pain: Pain Assessment Pain Score:   3 Pain Location: Ankle Pain Orientation: Right Pain Intervention(s): Medication (See eMAR)       See FIM for current functional status  Therapy/Group: Individual Therapy  Avo Schlachter 09/15/2011, 3:08 PM

## 2011-09-15 NOTE — Progress Notes (Signed)
Physical Therapy Note  Patient Details  Name: Anne Joyce MRN: [id number] Date of Birth: [birth year] Today's Date: 09/15/2011   14:00-14:30 pt complained of pain in rt leg unrated. Leg was elevated.  performed AAROM to rt leg and heel cord stretching. Leg was elevated above heart to decrease swelling and nursing was educated on technique to prevent excessive knee flexion. Pt declined ambulation due to fatigue.   Julian Reil 09/15/2011, 4:20 PM

## 2011-09-16 ENCOUNTER — Inpatient Hospital Stay (HOSPITAL_COMMUNITY): Payer: Worker's Compensation | Admitting: Occupational Therapy

## 2011-09-16 ENCOUNTER — Inpatient Hospital Stay (HOSPITAL_COMMUNITY): Payer: Worker's Compensation | Admitting: *Deleted

## 2011-09-16 ENCOUNTER — Inpatient Hospital Stay (HOSPITAL_COMMUNITY): Payer: Worker's Compensation | Admitting: Physical Therapy
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
## 2011-09-16 NOTE — Progress Notes (Signed)
Physical Therapy Session Note  Patient Details  Name: Anne Joyce MRN: [id number] Date of Birth: [birth year]  Today's Date: 09/16/2011 Time: 3244-0102 Time Calculation (min): 40 min  Short Term Goals: Week 1:  PT Short Term Goal 1 (Week 1): = LTGs  Therapy Documentation Precautions:  Precautions Precautions: Fall Required Braces or Orthoses: Other Brace/Splint Other Brace/Splint: L AFO during wt bearing Restrictions Weight Bearing Restrictions: Yes RLE Weight Bearing: Non weight bearing LLE Weight Bearing: Weight bearing as tolerated Pain: Pain Assessment Pain Score: 6/10 R lower leg/ankle and is not due for pain meds for 40 minutes.  Therapeutic Exercise:(30') UE exercises with blue theraband for triceps presses, shoulder adduction, w/c propulsion x 250' for UE strengthening Gait Training:(15') 3 x 40' using RW S/min-A  And able to maintain L LE NWB  See FIM for current functional status  Therapy/Group: Individual Therapy  Rex Kras 09/16/2011, 3:44 PM

## 2011-09-16 NOTE — Progress Notes (Signed)
Occupational Therapy Session Notes  Patient Details  Name: Anne Joyce MRN: [id number] Date of Birth: 1965-02-13  Today's Date: 09/16/2011  Short Term Goals: Week 1:  OT Short Term Goal 1 (Week 1): Pt will perform LB bathing sit to stand with min assist 2/3 sessions. OT Short Term Goal 2 (Week 1): Pt will perform LB dressing sit to stand with AE PRN and min assist 2/3 sessions. OT Short Term Goal 3 (Week 1): Pt will perform toiltet transfers with min assist using RW and 3:1. OT Short Term Goal 4 (Week 1): Pt will perform toileting with min assist sit to stand.  Skilled Therapeutic Interventions/Progress Updates:   Session #1 [phone] - 45 Minutes Individual Therapy Patient with 7/10 pain in RLE; RN aware Upon entering room patient found supine in bed. Engaged in bed mobility for min assist stand pivot transfer into w/c using rolling walker. Patient then performed UB/LB bathing & dressing in sit -> stand position at sink level. Focused skilled intervention on education, demonstration, & return demonstration of reacher, sock aid, & long handled sponge, overall activity tolerance/endurance, sit/stands, dynamic standing balance/tolerance/endurance, and overall safety. At end of session left patient seated in w/c beside bed with call bell & phone within reach.   Session #2 [id number] - 40 Minutes Individual Therapy No complaints of pain Upon entering room patient supine in bed with request to use restroom. Patient ambulated from edge of bed -> bathroom for toilet transfer and toileting using rolling walker. Patient with difficult time pulling underwear and pants up to waist. After toileting focused skilled intervention on therapeutic activity of w/c propulsion and maneuvering from room <-> 1st floor and throughout gift shop. At end of session left patient seated in w/c beside bed with call bell & phone within reach.  Precautions:  Precautions Precautions: Fall Required Braces or  Orthoses: Other Brace/Splint Other Brace/Splint: L AFO during wt bearing Restrictions Weight Bearing Restrictions: Yes RLE Weight Bearing: Non weight bearing LLE Weight Bearing: Weight bearing as tolerated  See FIM for current functional status  Rue Valladares 09/16/2011, 8:18 AM

## 2011-09-16 NOTE — Progress Notes (Signed)
Physical Therapy Note  Patient Details  Name: Anne Joyce MRN: [id number] Date of Birth: 08-05-1965 Today's Date: 09/16/2011  1000-1055 (55 minutes) group Pain : no complaint of pain Pt participated in PT group session focused on UE strength/endurance; Nustep Level 5 x 10 minutes for general UE strengthening /endurance (perceived exertion = 15 HARD) ; wc mobility 120 feet X 2 (room >< gym) SBA; wc pushups 3 X 5 (unable to complete bilateral elbow extension); bilateral UE strengthening using bar with green theraband (2 X 15) - elbow extension, shoulder depression , scapular retraction, biceps curls.   Elwood Bazinet,JIM 09/16/2011, 8:41 AM

## 2011-09-17 ENCOUNTER — Inpatient Hospital Stay (HOSPITAL_COMMUNITY): Payer: Worker's Compensation | Admitting: *Deleted
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
## 2011-09-17 NOTE — Progress Notes (Signed)
Physical Therapy Session Note  Patient Details  Name: Anne Joyce MRN: [id number] Date of Birth: [birth year]  Today's Date: 09/17/2011 Time:  - 1445-1540 (55 minutes) group    Short Term Goals: Week 1:  PT Short Term Goal 1 (Week 1): = LTGs  Skilled Therapeutic Interventions/Progress Updates: Pt participated in PT group session focused on UE strengthening/endurance     Therapy Documentation Precautions:  Precautions Precautions: Fall Required Braces or Orthoses: Other Brace/Splint Other Brace/Splint: L AFO during wt bearing Restrictions Weight Bearing Restrictions: Yes RLE Weight Bearing: Non weight bearing LLE Weight Bearing: Weight bearing as tolerated    Vital Signs: Therapy Vitals Temp: 98.2 F (36.8 C) Temp src: Oral Pulse Rate: 76  Resp: 16  BP: 141/68 mmHg Patient Position, if appropriate: Lying Oxygen Therapy SpO2: 97 % O2 Device: None (Room air) Pain: no complaint of pain     Locomotion : Pt ambulates 20 feet X 2 RW NWB LT LE ; wc mobility - pt modified independent unit 150 feet level,controlled environment    :       Exercises: wc pushups  2 X 10    Other Treatments:  Standing ball toss with RW support X 30 reps (standing balance)  See FIM for current functional status    Ronny Ruddell,JIM 09/17/2011, 8:39 AM

## 2011-09-18 ENCOUNTER — Inpatient Hospital Stay (HOSPITAL_COMMUNITY): Payer: Worker's Compensation | Admitting: Occupational Therapy

## 2011-09-18 ENCOUNTER — Inpatient Hospital Stay (HOSPITAL_COMMUNITY): Payer: Worker's Compensation

## 2011-09-18 DIAGNOSIS — G5700 Lesion of sciatic nerve, unspecified lower limb: Secondary | ICD-10-CM

## 2011-09-18 DIAGNOSIS — S71109A Unspecified open wound, unspecified thigh, initial encounter: Secondary | ICD-10-CM

## 2011-09-18 DIAGNOSIS — Z5189 Encounter for other specified aftercare: Secondary | ICD-10-CM

## 2011-09-18 DIAGNOSIS — S71009A Unspecified open wound, unspecified hip, initial encounter: Secondary | ICD-10-CM
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
## 2011-09-18 NOTE — Progress Notes (Signed)
Physical Therapy Session Note  Patient Details  Name: Anne Joyce MRN: [id number] Date of Birth: 1965/03/18  Today's Date: 09/18/2011 Time: 1027-2536 Time Calculation (min): 60 min  Short Term Goals: Week 1:  PT Short Term Goal 1 (Week 1): = LTGs  Skilled Therapeutic Interventions/Progress Updates:   Treatment focused on HEP for functional strengthening of LEs with demonstration to pt's daughter, and dynamic standing balance activity to play various games on the Wii. Pt with overall supervision for transfers with RW, mod I w/c mobility on the unit to/from therapy, and mod I with standing balance during Wii games with 1 UE support on RW.  Therapy Documentation Precautions:  Precautions Precautions: Fall Required Braces or Orthoses: Other Brace/Splint Other Brace/Splint: L AFO during wt bearing Restrictions Weight Bearing Restrictions: Yes RLE Weight Bearing: Non weight bearing LLE Weight Bearing: Weight bearing as tolerated   Pain:  reports pain is managed.  See FIM for current functional status  Therapy/Group: Individual Therapy  Karolee Stamps [facility] 09/18/2011, 4:38 PM

## 2011-09-18 NOTE — Progress Notes (Signed)
Occupational Therapy Session Notes  Patient Details  Name: Anne Joyce MRN: [id number] Date of Birth: [birth year]  Today's Date: 09/18/2011  Short Term Goals: Week 1:  OT Short Term Goal 1 (Week 1): Pt will perform LB bathing sit to stand with min assist 2/3 sessions. OT Short Term Goal 2 (Week 1): Pt will perform LB dressing sit to stand with AE PRN and min assist 2/3 sessions. OT Short Term Goal 3 (Week 1): Pt will perform toiltet transfers with min assist using RW and 3:1. OT Short Term Goal 4 (Week 1): Pt will perform toileting with min assist sit to stand.  Skilled Therapeutic Interventions/Progress Updates:   Session #1 [phone] - 60 Minutes Individual Therapy No complaints of pain, "I had pain medicine before your session".  Patient found seated edge of bed. Engaged in edge of bed -> w/c stand pivot transfer using rolling walker with steady assist from therapist. Patient then gathered all necessary items for ADL and propelled self -> ADL apartment for ADL retraining at shower level. Focused skilled intervention on ADL transfers, overall activity tolerance/endurance, UB/LB dressing, compression wrapping to LLE for swelling, w/c mobility/propulsion, and grooming tasks seated at sink in w/c. Patient propelled self back to room at end of session and therapist left patient seated at sink to complete grooming tasks.   Session #2 1330-1400 - 30 Minutes Individual Therapy Patient with complaints of pain in LLE, RN notified Patient found seated edge of bed with patient's daughter present in room. Educated, demonstrated, and had patient's daughter return demonstrate compression wrapping to LLE for swelling. Patient then propelled self -> ADL apartment for education to patient's daughter regarding tub/shower transfer on/off tub transfer bench. At end of session patient propelled self back to room.   Precautions:  Precautions Precautions: Fall Required Braces or Orthoses: Other  Brace/Splint Other Brace/Splint: L AFO during wt bearing Restrictions Weight Bearing Restrictions: Yes RLE Weight Bearing: Non weight bearing LLE Weight Bearing: Weight bearing as tolerated  See FIM for current functional status  [name] 09/18/2011, 7:51 AM

## 2011-09-18 NOTE — Progress Notes (Signed)
Recreational Therapy Session Note  Patient Details  Name: Anne Joyce MRN: [id number] Date of Birth: 06-16-1965 Today's Date: 09/18/2011 Time:  930-10 Pain: 3/10 RLE, premedicated Skilled Therapeutic Interventions/Progress Updates: Discussed pt concerns with d/c plan as now pt states she won't be able to go to her sister's home and does not think her home is an option. She states she plans to discuss with her social worker today. Pt stood to toss and catch a ball B UEs with min A and RW in front to use to catch balance if needed to prevent WB on RLE and breaking precautions. Seated rest breaks as needed.  Discussed community reintegration, problem solving functional mobility and need for pain management. Pt is Mod I for simple TR tasks when seated w/c level.    Therapy/Group: Co-Treatment   [name] 09/18/2011, 10:41 AM

## 2011-09-18 NOTE — Progress Notes (Signed)
Physical Therapy Session Note  Patient Details  Name: Anne Joyce MRN: [id number] Date of Birth: 1965-04-17  Today's Date: 09/18/2011 Time: 2956-2130 Time Calculation (min): 42 min  Short Term Goals: Week 1:  PT Short Term Goal 1 (Week 1): = LTGs  Skilled Therapeutic Interventions/Progress Updates:    Discussed pt concerns with d/c plan as now pt states she won't be able to go to her sister's home and does not think her home is an option. She states she plans to discuss with her social worker today. Dynamic standing balance activity to work on standing tolerance and balance reactions with throwing and catching a ball with bilateral UEs with min A overall, RW in front to use to catch balance if needed to prevent WB on RLE and breaking precautions. Seated rest breaks as needed. Gait training with RW with supervision x 21', x 15' for functional mobility training. Returned to bed with RW to rest before next therapy session.  Therapy Documentation Precautions:  Precautions Precautions: Fall Required Braces or Orthoses: Other Brace/Splint Other Brace/Splint: L AFO during wt bearing Restrictions Weight Bearing Restrictions: Yes RLE Weight Bearing: Non weight bearing LLE Weight Bearing: Weight bearing as tolerated   Pain:  Premedicated. Reports 3/10 pain in leg.    See FIM for current functional status  Therapy/Group: Individual Therapy and Co-Treatment with Recreational Therapy  Karolee Stamps [facility] 09/18/2011, 10:13 AM

## 2011-09-19 ENCOUNTER — Inpatient Hospital Stay (HOSPITAL_COMMUNITY): Payer: Worker's Compensation | Admitting: Occupational Therapy

## 2011-09-19 ENCOUNTER — Inpatient Hospital Stay (HOSPITAL_COMMUNITY): Payer: Worker's Compensation

## 2011-09-19 DIAGNOSIS — G5700 Lesion of sciatic nerve, unspecified lower limb: Secondary | ICD-10-CM

## 2011-09-19 DIAGNOSIS — Z5189 Encounter for other specified aftercare: Secondary | ICD-10-CM

## 2011-09-19 NOTE — Progress Notes (Signed)
Physical Therapy Session Note  Patient Details  Name: Anne Joyce MRN: [id number] Date of Birth: October 13, 1965  Today's Date: 09/19/2011 Time: 0830-0928 Time Calculation (min): 58 min  Short Term Goals: Week 1:  PT Short Term Goal 1 (Week 1): = LTGs  Skilled Therapeutic Interventions/Progress Updates:    Focus on gait in home environment to negotiate obstacles and sidestepping x 30' x 2 with close supervision. In solarium practice various surfaces for transfers (couch, kitchen chair, recliner) and gait on carpeted surface with recommendations for how to adjust furniture in the home to aid with sit to stand pt completed with overall supervision. Pt with 1 LOB on carpet due to fatigue and decreased foot clearance with min A to recover. Discussed energy conservation techniques and how various surfaces can change exertion level - pt in agreement. W/c mobility on/off unit negotiating elevator and pushing buttons mod I. End of session therapist stretched R heel cord and used contract relax techniques to increase ROM. Pt with seated heel slides on RLE on floor to increase knee ROM and LAQ for functional strengthening and ROM.   Therapy Documentation Precautions:  Precautions Precautions: Fall Required Braces or Orthoses: Other Brace/Splint Other Brace/Splint: L AFO during wt bearing Restrictions Weight Bearing Restrictions: Yes RLE Weight Bearing: Non weight bearing LLE Weight Bearing: Weight bearing as tolerated   Pain: Reports pain is managed and premedicated prior to session.   Locomotion : Ambulation Ambulation/Gait Assistance: 5: Supervision   See FIM for current functional status  Therapy/Group: Individual Therapy  Karolee Stamps [facility] 09/19/2011, 12:15 PM

## 2011-09-19 NOTE — Progress Notes (Signed)
Occupational Therapy Session Note  Patient Details  Name: Anne Joyce MRN: [id number] Date of Birth: [birth year]  Today's Date: 09/19/2011 Time: 4098-1191 Time Calculation (min): 55 min  Short Term Goals: Week 1:  OT Short Term Goal 1 (Week 1): Pt will perform LB bathing sit to stand with min assist 2/3 sessions. OT Short Term Goal 2 (Week 1): Pt will perform LB dressing sit to stand with AE PRN and min assist 2/3 sessions. OT Short Term Goal 3 (Week 1): Pt will perform toiltet transfers with min assist using RW and 3:1. OT Short Term Goal 4 (Week 1): Pt will perform toileting with min assist sit to stand.  Skilled Therapeutic Interventions/Progress Updates:  Patient found supine in bed with complaints of pain and unwilling to participate in therapy until RN administered pain medication; notified RN. Patient then engaged in bed mobility to eat breakfast sitting edge of bed. After breakfast patient donned left shoe/AFO and ambulated from edge of bed -> bathroom for toilet transfer and toileting. Patient ambulated out of bathroom to engage in ADL retraining at sink level in sit -> stand position. Foucsed skilled intervention on dynamic standing balance/tolerance/endurance, ADL transfers, sit/stands, UB/LB bathing & dressing, use of AE prn, grooming tasks seated at sink, overall activity tolerance/endurance, and compression wrapping to LLE for swelling. At end of session patient propelled self -> therapy gym for next therapy session.   Precautions:  Precautions Precautions: Fall Required Braces or Orthoses: Other Brace/Splint Other Brace/Splint: L AFO during wt bearing Restrictions Weight Bearing Restrictions: Yes RLE Weight Bearing: Non weight bearing LLE Weight Bearing: Weight bearing as tolerated  See FIM for current functional status  Jadore Veals 09/19/2011, 9:41 AM

## 2011-09-19 NOTE — Progress Notes (Signed)
Occupational Therapy Session Note  Patient Details  Name: Anne Joyce MRN: [id number] Date of Birth: Jul 15, 1965  Today's Date: 09/19/2011 Time: 1417-1500 Time Calculation (min): 43 min  Short Term Goals: Week 1:  OT Short Term Goal 1 (Week 1): Pt will perform LB bathing sit to stand with min assist 2/3 sessions. OT Short Term Goal 2 (Week 1): Pt will perform LB dressing sit to stand with AE PRN and min assist 2/3 sessions. OT Short Term Goal 3 (Week 1): Pt will perform toiltet transfers with min assist using RW and 3:1. OT Short Term Goal 4 (Week 1): Pt will perform toileting with min assist sit to stand.  Skilled Therapeutic Interventions/Progress Updates:    1:1 OT with focus on BUE strengthening to assist with transfers and overall mobility.  Pt requesting to focus on BUE strengthening.  Use of 3# weighted dowel rod with bicep curls and chest presses with volleying ball back to therapist.  Block pushups 3 sets of 10. PNF pattern movements with blue theraband with R/LUE and scapular retraction. Pt reports some stiffness in bilat shoulder and performed shoulder shrugs, forward and backward rotations, and trunk rotations to decrease stiffness.  Pt close supervision transfer from mat to w/c and propelled self back to room with supervision.  Therapy Documentation Precautions:  Precautions Precautions: Fall Required Braces or Orthoses: Other Brace/Splint Other Brace/Splint: L AFO during wt bearing Restrictions Weight Bearing Restrictions: Yes RLE Weight Bearing: Non weight bearing LLE Weight Bearing: Weight bearing as tolerated Pain: Pain Assessment Pain Score:   7  See FIM for current functional status  Therapy/Group: Individual Therapy  Leonette Monarch 09/19/2011, 3:30 PM
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
## 2011-09-19 NOTE — Progress Notes (Signed)
Physical Therapy Session Note  Patient Details  Name: Anne Joyce MRN: [id number] Date of Birth: 1965-12-09  Today's Date: 09/19/2011 Time: 1191-4782 Time Calculation: 29 minutes  Short Term Goals: Week 1:  PT Short Term Goal 1 (Week 1): = LTGs  Skilled Therapeutic Interventions/Progress Updates:   Independently put on L shoe with AFO, needed assist with sock on RLE due to no AD. Supervision with transfers overall during session with RW. Focus on UE strengthening to aid with overall mobility: 4# straight weight for bicep curls, shoulder press, and shoulder flexion x 10 reps x 3 sets. With yellow medicine ball PNF diagonals and rotation for UE and abdominal strengthening x 10 reps x 3 sets both directions. Shoulder retraction with resistive tubing x 10 reps x 3 sets.  Therapy Documentation Precautions:  Precautions Precautions: Fall Required Braces or Orthoses: Other Brace/Splint Other Brace/Splint: L AFO during wt bearing Restrictions Weight Bearing Restrictions: Yes RLE Weight Bearing: Non weight bearing LLE Weight Bearing: Weight bearing as tolerated   Pain:  Denies pain.   Locomotion : Ambulation Ambulation/Gait Assistance: 5: Supervision   See FIM for current functional status  Therapy/Group: Individual Therapy  Karolee Stamps [facility] 09/19/2011, 2:05 PM
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
## 2011-09-20 ENCOUNTER — Inpatient Hospital Stay (HOSPITAL_COMMUNITY): Payer: Self-pay

## 2011-09-20 ENCOUNTER — Inpatient Hospital Stay (HOSPITAL_COMMUNITY): Payer: Self-pay | Admitting: Physical Therapy

## 2011-09-20 ENCOUNTER — Inpatient Hospital Stay (HOSPITAL_COMMUNITY): Payer: Worker's Compensation | Admitting: Occupational Therapy

## 2011-09-20 DIAGNOSIS — Z5189 Encounter for other specified aftercare: Secondary | ICD-10-CM

## 2011-09-20 DIAGNOSIS — G5700 Lesion of sciatic nerve, unspecified lower limb: Secondary | ICD-10-CM

## 2011-09-20 NOTE — Progress Notes (Signed)
Physical Therapy Note  Patient Details  Name: Anne Joyce MRN: [id number] Date of Birth: 27-Nov-1965 Today's Date: 09/20/2011  Pt missed 60 minutes of skilled PT session due to refusal. Pt reports still having a headache and not feeling well. States she may try later session if she is feeling better. RN notified and aware.    Karolee Stamps [facility] 09/20/2011, 11:53 AM
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
## 2011-09-20 NOTE — Progress Notes (Signed)
Occupational Therapy Note  Patient Details  Name: Anne Joyce MRN: [id number] Date of Birth: 01/14/1965 Today's Date: 09/20/2011  7829-5621 - 10 Minutes Individual Therapy Patient missed 35 minutes of skilled occupational therapy secondary to refusal. Patient found supine in bed upon entering room with no lights on. Patient with complaints of soreness and headache, "I think it may turn into a migraine.". Therapist encouraged patient to participate, yet patient just stated, "I just don't feel good today.". Therapist completed compression wrapping to LLE and left patient supine in bed with call bell, phone, and breakfast tray within reach. Patient even stated she didn't want to eat breakfast at this time. Notified RN.   Yarissa Reining 09/20/2011, 8:06 AM

## 2011-09-20 NOTE — Progress Notes (Signed)
Social Work Patient ID: Anne Joyce, female   DOB: May 03, 1965, 46 y.o.   MRN: [id number]  Met yesterday with patient and spoke with Worker's comp CM today to review team conference information.  Per pt's report, she no longer has a d/c location to return to.  Had spoken with her at the beginning of this week about considering SNF transfer to which pt was agreeable, however, pt is reaching a modified independent to supervision level which is not appropriate for SNF.  Spoke with both about changing search for a RH or ALF.  Case Manager to speak with a representative from a local RH to check bed availability.  Have explained that pt is more than ready for d/c from our standpoint.  Awaiting news from CM at this time.  Mckyla Deckman

## 2011-09-20 NOTE — Progress Notes (Signed)
Physical Therapy Note  Patient Details  Name: Anne Joyce MRN: [id number] Date of Birth: 02-06-1965 Today's Date: 09/20/2011  Attempted to see pt at 1345 to make up missed time for AM session, but pt refused.  Pt then missed 30 min of skilled PT (session scheduled for 1545 ) due to reporting she is still not feeling well and could not participate.    Karolee Stamps [facility] 09/20/2011, 4:27 PM

## 2011-09-21 ENCOUNTER — Inpatient Hospital Stay (HOSPITAL_COMMUNITY): Payer: Worker's Compensation | Admitting: Occupational Therapy

## 2011-09-21 ENCOUNTER — Inpatient Hospital Stay (HOSPITAL_COMMUNITY): Payer: Worker's Compensation | Admitting: *Deleted

## 2011-09-21 ENCOUNTER — Inpatient Hospital Stay (HOSPITAL_COMMUNITY): Payer: Worker's Compensation

## 2011-09-21 MED ORDER — TUBERCULIN PPD 5 UNIT/0.1ML ID SOLN
5.0000 [IU] | Freq: Once | INTRADERMAL | Status: AC
  Administered 2011-09-21: 5 [IU] via INTRADERMAL
  Filled 2011-09-21: qty 0.1

## 2011-09-21 NOTE — Progress Notes (Signed)
Occupational Therapy Session Note & Weekly Progress Note  Patient Details  Name: Anne Joyce MRN: [id number] Date of Birth: 1965/06/10  Today's Date: 09/21/2011 Time: 4098-1191 Time Calculation (min): 45 min [id number] - 45 Minutes Individual Therapy No complaints of pain Upon entering room patient supine in bed with statement, "I feel better this morning.". Engaged in bed mobility for edge of bed -> w/c stand pivot transfer without AFO/shoe donned. Patient then performed UB/LB bathing & dressing at sink level in sit -> stand position. Focused skilled intervention on ADL retraining with use of AE, sit/stands, overall activity tolerance/endurance, dynamic standing balance/tolerance/endurance, and grooming tasks seated at sink in w/c. At end of session left patient with call bell, phone, and breakfast tray in reach. RN also present for medication administration.   --------------------------------------------------------------------------------------------------------   WEEKLY PROGRESS NOTE  Patient has met 4 of 4 short term goals.  Patient is making steady progress on CIR. Patient at an overall supervision -> occasional steady assist level for BADLs. Patient continues to demonstrate decreased dynamic standing balance/tolerance/endurance when pulling underwear & pants up to waist.  Patient performs better when the AFO/shoe is donned. Patient plans to d/c -> SNF vs Assisted Living placement. Goals set for an overall supervision -> modified independent level.   Patient continues to demonstrate the following deficits: decreased independence with ADLs/IADLs, decreased independence with functional mobility, decreased dynamic standing balance/tolerance/endurance, decreased independence with transfers, decreased overall activity tolerance/endurance. Therefore, patient will continue to benefit from skilled OT intervention to enhance overall performance with BADL, iADL and Reduce care partner  burden.  Patient progressing toward long term goals..  Continue plan of care.  OT Short Term Goals Week 1:  OT Short Term Goal 1 (Week 1): Pt will perform LB bathing sit to stand with min assist 2/3 sessions. OT Short Term Goal 1 - Progress (Week 1): Met OT Short Term Goal 2 (Week 1): Pt will perform LB dressing sit to stand with AE PRN and min assist 2/3 sessions. OT Short Term Goal 2 - Progress (Week 1): Met OT Short Term Goal 3 (Week 1): Pt will perform toiltet transfers with min assist using RW and 3:1. OT Short Term Goal 3 - Progress (Week 1): Met OT Short Term Goal 4 (Week 1): Pt will perform toileting with min assist sit to stand. OT Short Term Goal 4 - Progress (Week 1): Met  Week 2:  OT Short Term Goal 1 (Week 2): Short Term Goals = Long Term Goals  Skilled Therapeutic Interventions/Progress Updates:  Balance/vestibular training;Community reintegration;Discharge planning;DME/adaptive equipment instruction;Functional mobility training;Neuromuscular re-education;Pain management;Patient/family education;Psychosocial support;Self Care/advanced ADL retraining;Skin care/wound managment;Splinting/orthotics;Therapeutic Activities;Therapeutic Exercise;UE/LE Strength taining/ROM;UE/LE Coordination activities;Wheelchair propulsion/positioning   Precautions:  Precautions Precautions: Fall Required Braces or Orthoses: Other Brace/Splint Other Brace/Splint: L AFO during wt bearing Restrictions Weight Bearing Restrictions: Yes RLE Weight Bearing: Non weight bearing LLE Weight Bearing: Weight bearing as tolerated  See FIM for current functional status  Anne Joyce 09/21/2011, 8:35 AM

## 2011-09-21 NOTE — Progress Notes (Signed)
Physical Therapy Session Note  Patient Details  Name: Anne Joyce MRN: [id number] Date of Birth: 1965-06-07  Today's Date: 09/21/2011 Time: 2440-1027 Time Calculation (min): 30 min  Reports pain is managed. Dynamic gait training to pick up items from various heights and negotiate obstacles to simulate home environment mobility at mod I level. UE and core strengthening with weighted medicine ball for PNF diagonals x 3 sets of 10 reps. Returned to bed mod I squat pivot transfer and positioned herself.      Therapy Documentation Precautions:  Precautions Precautions: Fall Required Braces or Orthoses: Other Brace/Splint Other Brace/Splint: L AFO during wt bearing Restrictions Weight Bearing Restrictions: Yes RLE Weight Bearing: Non weight bearing LLE Weight Bearing: Weight bearing as tolerated   See FIM for current functional status  Therapy/Group: Individual Therapy  Karolee Stamps [facility] 09/21/2011, 4:37 PM

## 2011-09-21 NOTE — Progress Notes (Signed)
Physical Therapy Session Note  Patient Details  Name: Anne Joyce MRN: [id number] Date of Birth: 1965/04/14  Today's Date: 09/21/2011 Time:  15:02-15:45 (43)  Skilled Therapeutic Interventions/Progress Updates:  Pt meeting with liaison for next placement, but able to fit in later in the day.  Sit<>stand and squat-pivot transfers (x4) with S only and increased effort. Safe technique noted.   Gait training 1x30' with S only NWB on R. Distance limited by fatigue  Seated therex for LE strengthening and ROM:  - LAQ x20 with #2 on L, 0# on R, focusing on R knee flexion as well - marching with same weights x20 - Core/UE strengthening with yellow weighted ball unsupported sitting with diagonals each direction x20 and pass ball behind back both directions x20 Pt needing multiple rest breaks due to fatigue and cues for technique. Reinforced education on her TKA precautions (no pillow under knees), etc.   Nustep for LLE and bil UE strengthening and activity tolerance, level 5 x28min. Pt needing 2 rest breaks.      Therapy Documentation Precautions:  Precautions Precautions: Fall Required Braces or Orthoses: Other Brace/Splint Other Brace/Splint: L AFO during wt bearing Restrictions Weight Bearing Restrictions: Yes RLE Weight Bearing: Non weight bearing LLE Weight Bearing: Weight bearing as tolerated    Pain: 5/10, pt premedicated and adjusted tx prn  See FIM for current functional status  Therapy/Group: Individual Therapy  Virl Cagey 09/21/2011, 3:27 PM

## 2011-09-21 NOTE — Progress Notes (Signed)
Physical Therapy Weekly Progress Note  Patient Details  Name: Anne Joyce MRN: [id number] Date of Birth: 06-02-1965  Today's Date: 09/21/2011 Time: 0865-7846 Time Calculation (min): 60 min Individual therapy. Focus on functional transfers without AD and without L AFO on to simulate transfers needed for ADLs in the AM or BSC transfers at night using squat pivot technique. Reluctant to attempt initially but progressed to supervision with transfer. Reviewed HEP for LE therex for functional strengthening for heel slides, ankle pump/calf stretch, hip abduction, and SAQ x 10 reps x 3 sets. Gait with RW for mobility x 30' with supervision to fatigue. UE therex on ergometer random program level 5 x 10 minutes (5 min forward, 5 min backwards) and w/c propulsion mod I on unit.   Short term goals not set due to estimated length of stay.  D/c was originally set for 9/12 but due to sister's home no longer being an option, team is investigating other options. Pt is at overall supervision to mod I level with w/c and short distance gait. Pt is using AFO on LLE for foot drop.  Patient continues to demonstrate the following deficits: decreased standing balance, decreased ROM, impaired gait, decreased muscular and cardiorespiratory endurance and therefore will continue to benefit from skilled PT intervention to enhance overall performance with activity tolerance, balance and ability to compensate for deficits.  See Patient's Care Plan for progression toward long term goals.  Patient progressing toward long term goals..  Continue plan of care.  Skilled Therapeutic Interventions/Progress Updates:  Ambulation/gait training;Balance/vestibular training;Community reintegration;Discharge planning;Functional mobility training;DME/adaptive equipment instruction;Neuromuscular re-education;Pain management;Patient/family education;Psychosocial support;Skin care/wound management;Splinting/orthotics;Stair training;UE/LE  Coordination activities;UE/LE Strength taining/ROM;Therapeutic Exercise;Therapeutic Activities;Wheelchair propulsion/positioning   Therapy Documentation Precautions:  Precautions Precautions: Fall Required Braces or Orthoses: Other Brace/Splint Other Brace/Splint: L AFO during wt bearing Restrictions Weight Bearing Restrictions: Yes RLE Weight Bearing: Non weight bearing LLE Weight Bearing: Weight bearing as tolerated   Pain: Pain Assessment Pain Assessment: 0-10 Pain Score: 0-No pain Faces Pain Scale: No hurt  See FIM for current functional status  Therapy/Group: Individual Therapy  Karolee Stamps [facility] 09/21/2011, 10:12 AM
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
## 2011-09-21 NOTE — Progress Notes (Signed)
Social Work Patient ID: Anne Joyce, female   DOB: 03-Mar-1965, 46 y.o.   MRN: [id number]  Pt currently completing paperwork with admissions coordinator from [facility].  They can admit patient to their facility, covered under Worker's Comp, on Saturday 9/14 and pt is agreeable to this plan.  Have alerted team and MD/PA of plans.   This ALF will also arrange follow up PT at their facility.    Azlaan Isidore

## 2011-09-22 ENCOUNTER — Inpatient Hospital Stay (HOSPITAL_COMMUNITY): Payer: Worker's Compensation | Admitting: Occupational Therapy

## 2011-09-22 ENCOUNTER — Inpatient Hospital Stay (HOSPITAL_COMMUNITY): Payer: Worker's Compensation

## 2011-09-22 MED ORDER — OXYCODONE HCL 5 MG PO TABS: 5.0000 mg | ORAL_TABLET | Freq: Four times a day (QID) | ORAL | Status: AC | PRN

## 2011-09-22 MED ORDER — METHOCARBAMOL 500 MG PO TABS: 500.0000 mg | ORAL_TABLET | Freq: Four times a day (QID) | ORAL | Status: AC

## 2011-09-22 MED ORDER — TRAMADOL HCL 50 MG PO TABS: 50.0000 mg | ORAL_TABLET | Freq: Four times a day (QID) | ORAL | Status: AC | PRN

## 2011-09-22 MED ORDER — POLYETHYLENE GLYCOL 3350 17 G PO PACK: 17.0000 g | PACK | Freq: Two times a day (BID) | ORAL | Status: AC

## 2011-09-22 MED ORDER — OXYCODONE HCL 20 MG PO TB12: ORAL_TABLET | ORAL | Status: DC

## 2011-09-22 MED ORDER — ACETAMINOPHEN 325 MG PO TABS: 325.0000 mg | ORAL_TABLET | ORAL | Status: DC | PRN

## 2011-09-22 NOTE — Progress Notes (Signed)
Social Work Patient ID: Anne Joyce, female   DOB: 09-11-1965, 46 y.o.   MRN: [id number]  Have made final arrangements today for pt's discharge tomorrow afternoon to [facility].  Worker's Comp CM has arranged for private transportation service to pick up pt between 1 and 1:30 tomorrow.  All DME has been ordered via [employer] and should be delivered to her hospital room by tomorrow a.m.  Please contact me on my phone at [phone] if any questions or concerns arise tomorrow about this patient/ plans, etc.  [name], Valentina Gu [phone]

## 2011-09-22 NOTE — Progress Notes (Signed)
Occupational Therapy Session Notes & Discharge Summary  Patient Details  Name: Anne Joyce MRN: [id number] Date of Birth: 12-12-1965  Today's Date: 09/22/2011  SESSION NOTES  Session #1 [phone] - 60 Minutes Individual Therapy Patient with 8/10 pain; RN made aware Patient found supine in bed, not particularly wanting to participate in therapy; "I have therapy today?". Therapist encouraged patient to participate and patient willing. Patient sat edge of bed and performed squat pivot transfer into w/c without shoe/AFO. While in w/c patient then gathered all necessary items for ADL and engaged in grooming task of brushing teeth at sink. Patient also performed stand pivot toilet transfer <-> w/c and toileting at supervision level. Patient then propelled self -> ADL apartment for tub/shower transfer on/off tub transfer bench and ADL at shower level. Patient performed UB/LB dressing and then propelled self back to room. Therapist left patient seated in w/c with AE (reacher, sock aid, long handled shoe horn) to complete LB dressing of donning socks and shoes.   Session #2 [id number] - 45 Minutes Individual Therapy  No complaints of pain Therapeutic exercise focusing on UE strength and core strength. Also focused on toilet transfer, toileting, and functional ambulation while adhering to NWB precautions.   -------------------------------------------------------------------------------------------------------------------------  DISCHARGE SUMMARY Patient has met 7 of 8 long term goals due to improved activity tolerance, improved balance, postural control, ability to compensate for deficits, improved attention, improved awareness and improved coordination.  Patient to discharge at overall Supervision level.  Patient plans to discharge to an assisted living.   Reasons goals not met: Patient did not meet simple meal prep goal at this time. Patient plans to go to a cafeteria for meals until further  notice.   Recommendation:  Patient will benefit from ongoing skilled OT services in home health setting to continue to advance functional skills in the area of BADL, iADL and Reduce care partner burden.  Equipment: Tub Transfer bench and BSC  Reasons for discharge: treatment goals met and discharge from hospital  Patient/family agrees with progress made and goals achieved: Yes  Precautions/Restrictions  Precautions Precautions: Fall Required Braces or Orthoses: Other Brace/Splint Other Brace/Splint: Left AFO Restrictions Weight Bearing Restrictions: Yes RLE Weight Bearing: Non weight bearing LLE Weight Bearing: Weight bearing as tolerated  Pain Pain Assessment Pain Assessment: 0-10 Pain Score:   8 Pain Type: Acute pain Pain Location: Leg Pain Orientation: Right Pain Descriptors: Aching Pain Intervention(s): RN made aware  ADL - See FIM  Vision/Perception  Vision - History Baseline Vision: Wears glasses all the time Patient Visual Report: No change from baseline Vision - Assessment Eye Alignment: Within Functional Limits Perception Perception: Within Functional Limits Praxis Praxis: Intact   Cognition Overall Cognitive Status: Appears within functional limits for tasks assessed Arousal/Alertness: Awake/alert Orientation Level: Oriented X4 Selective Attention: Appears intact Memory: Appears intact Awareness: Appears intact Problem Solving: Appears intact Safety/Judgment: Appears intact  Sensation Sensation Light Touch: Appears Intact (bilateral UEs) Coordination Gross Motor Movements are Fluid and Coordinated: Yes (bilateral UEs) Fine Motor Movements are Fluid and Coordinated: Yes (bilateral UEs)  Motor - See Discharge Navigator  Mobility - See Discharge Navigator  Trunk/Postural Assessment - See Discharge Navigator  Balance- See Discharge Navigator  Extremity/Trunk Assessment RUE Assessment RUE Assessment: Within Functional Limits LUE  Assessment LUE Assessment: Within Functional Limits  See FIM for current functional status  Anira Senegal 09/22/2011, 7:56 AM
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
## 2011-09-23 DIAGNOSIS — Z5189 Encounter for other specified aftercare: Secondary | ICD-10-CM

## 2011-09-23 DIAGNOSIS — G5700 Lesion of sciatic nerve, unspecified lower limb: Secondary | ICD-10-CM

## 2011-09-23 NOTE — Progress Notes (Signed)
Subjective/Complaints: No complaints  Reviewed d/c summary- plan for d/c to SNF/rehab facility Objective: Vital Signs: Blood pressure 119/75, pulse 76, temperature 98.1 F (36.7 C), temperature source Oral, resp. rate 20, height 5\' 4"  (1.626 m), weight 208 lb 8.9 oz (94.6 kg), last menstrual period 09/06/2011, SpO2 100.00%. Wt Readings from Last 3 Encounters:  09/20/11 208 lb 8.9 oz (94.6 kg)    Physical Exam:  Nursing note and vitals reviewed.  Appears well  CV:RR ABd- soft nontender Assessment/Plan: Comminuted distal right tibial shaft fracture requiring  intramedullary nailing.  2. 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  Ok for discharge Problem List and Plan:  1. DVT Prophylaxis/Anticoagulation: Pharmaceutical: Lovenox  2. Pain Management: using oxycodone every four hours.added oxycontin CR for more consistent relief and scheduled robaxin 3. Mood: much better mood.. Has a supportive family. Will monitor for now. LCSW to follow up for formal evaluation.  4. Neuropsych: This patient is capable of making decisions on his/her own behalf.  5. HTN: bp under reasonable control. 6. Acute on chronic anemia: continue iron supplement. 7. Constipation: Continue miralax and stool softeners. 8. Sciatic (peroneal) nerve injury- continue care. AFO for gait has been quite beneficial 9. Wound care: local care LOS (Days) 12 A FACE TO FACE EVALUATION WAS PERFORMED  Anne Joyce 09/23/2011, 8:12 AM

## 2011-09-23 NOTE — Progress Notes (Signed)
Discharge discontinued til Monday. PPD skin test negative. Checked at 4pm.

## 2011-09-24 NOTE — Progress Notes (Signed)
Subjective/Complaints: No complaints  Reviewed d/c summary- plan for d/c to SNF/rehab facility  A/P- see previous notes-- awaiting d/c to SNF
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
## 2011-09-25 NOTE — Progress Notes (Signed)
Social Work  Discharge Note  The overall goal for the admission was met for:   Discharge location: No- pt's initial plan to d/c to her sister's home "fell through" and plan changed to ALF placement  Length of Stay: No - LOS delayed due to complications with Worker's Comp coverage of ALF/ medicines  Discharge activity level: Yes - modified independent overall  Home/community participation: Yes  Services provided included: MD, RD, PT, OT, RN, TR, Pharmacy, Neuropsych and SW  Financial Services: Worker's Comp  Follow-up services arranged: Home Health: PT via [facility] staff, DME: 20x16 Breezy w/c, cushion, rolling walker, tub bench and bsc arranged via [facility] (per [facility]) and Patient/Family has no preference for HH/DME agencies  Comments (or additional information):  Patient/Family verbalized understanding of follow-up arrangements: Yes  Individual responsible for coordination of the follow-up plan: patient  Confirmed correct DME delivered: Anne Joyce 09/25/2011    Anne Joyce

## 2011-09-25 NOTE — Progress Notes (Signed)
Patient discharge to [facility]  Discharge information packet provided by Amada Jupiter, LCSW.  Transportation arrived and escorted by inpatient rehab NT.

## 2011-10-10 ENCOUNTER — Emergency Department (HOSPITAL_COMMUNITY): Payer: Worker's Compensation

## 2011-10-10 ENCOUNTER — Inpatient Hospital Stay (HOSPITAL_COMMUNITY)
Admission: EM | Admit: 2011-10-10 | Discharge: 2011-10-15 | DRG: 193 | Disposition: A | Discharge status: Skilled Nursing Facility | Payer: Worker's Compensation | Attending: Family Medicine | Admitting: Family Medicine

## 2011-10-10 DIAGNOSIS — S82209B Unspecified fracture of shaft of unspecified tibia, initial encounter for open fracture type I or II: Secondary | ICD-10-CM | POA: Diagnosis present

## 2011-10-10 DIAGNOSIS — S71109A Unspecified open wound, unspecified thigh, initial encounter: Secondary | ICD-10-CM | POA: Diagnosis present

## 2011-10-10 DIAGNOSIS — E871 Hypo-osmolality and hyponatremia: Secondary | ICD-10-CM

## 2011-10-10 DIAGNOSIS — N390 Urinary tract infection, site not specified: Secondary | ICD-10-CM

## 2011-10-10 DIAGNOSIS — S82201B Unspecified fracture of shaft of right tibia, initial encounter for open fracture type I or II: Secondary | ICD-10-CM | POA: Diagnosis present

## 2011-10-10 DIAGNOSIS — Y990 Civilian activity done for income or pay: Secondary | ICD-10-CM

## 2011-10-10 DIAGNOSIS — J189 Pneumonia, unspecified organism: Principal | ICD-10-CM

## 2011-10-10 DIAGNOSIS — W3400XA Accidental discharge from unspecified firearms or gun, initial encounter: Secondary | ICD-10-CM

## 2011-10-10 DIAGNOSIS — Y9229 Other specified public building as the place of occurrence of the external cause: Secondary | ICD-10-CM

## 2011-10-10 DIAGNOSIS — Z23 Encounter for immunization: Secondary | ICD-10-CM

## 2011-10-10 DIAGNOSIS — D649 Anemia, unspecified: Secondary | ICD-10-CM

## 2011-10-10 DIAGNOSIS — Z21 Asymptomatic human immunodeficiency virus [HIV] infection status: Secondary | ICD-10-CM | POA: Diagnosis present

## 2011-10-10 DIAGNOSIS — R509 Fever, unspecified: Secondary | ICD-10-CM

## 2011-10-10 DIAGNOSIS — S91009A Unspecified open wound, unspecified ankle, initial encounter: Secondary | ICD-10-CM | POA: Diagnosis present

## 2011-10-10 DIAGNOSIS — S81009A Unspecified open wound, unspecified knee, initial encounter: Secondary | ICD-10-CM | POA: Diagnosis present

## 2011-10-10 DIAGNOSIS — S71009A Unspecified open wound, unspecified hip, initial encounter: Secondary | ICD-10-CM | POA: Diagnosis present

## 2011-10-10 DIAGNOSIS — Z79899 Other long term (current) drug therapy: Secondary | ICD-10-CM

## 2011-10-10 HISTORY — DX: Human immunodeficiency virus (HIV) disease: B20

## 2011-10-10 HISTORY — DX: Asymptomatic human immunodeficiency virus (hiv) infection status: Z21

## 2011-10-10 LAB — COMPREHENSIVE METABOLIC PANEL
ALT: 12 U/L (ref 0–35)
Alkaline Phosphatase: 81 U/L (ref 39–117)
CO2: 25 mEq/L (ref 19–32)
GFR calc Af Amer: >90 mL/min (ref >90)
Glucose, Bld: 120 mg/dL — ABNORMAL HIGH (ref 70–99)
Potassium: 3.8 mEq/L (ref 3.5–5.1)
Sodium: 130 mEq/L — ABNORMAL LOW (ref 135–145)
Total Protein: 8.1 g/dL (ref 6.0–8.3)

## 2011-10-10 LAB — URINE MICROSCOPIC-ADD ON

## 2011-10-10 LAB — CBC WITH DIFFERENTIAL/PLATELET
Eosinophils Absolute: 0 10*3/uL (ref 0.0–0.7)
Lymphocytes Relative: 21 % (ref 12–46)
Lymphs Abs: 2.6 10*3/uL (ref 0.7–4.0)
Neutro Abs: 9 10*3/uL — ABNORMAL HIGH (ref 1.7–7.7)
Neutrophils Relative %: 71 % (ref 43–77)
Platelets: 220 10*3/uL (ref 150–400)
RBC: 4 MIL/uL (ref 3.87–5.11)
WBC: 12.6 10*3/uL — ABNORMAL HIGH (ref 4.0–10.5)

## 2011-10-10 LAB — URINALYSIS, ROUTINE W REFLEX MICROSCOPIC
Bilirubin Urine: NEGATIVE
Hgb urine dipstick: NEGATIVE
Specific Gravity, Urine: 1.038 — ABNORMAL HIGH (ref 1.005–1.030)
Urobilinogen, UA: 1 mg/dL (ref 0.0–1.0)

## 2011-10-10 MED ORDER — GUAIFENESIN-DM 100-10 MG/5ML PO SYRP
5.0000 mL | ORAL_SOLUTION | ORAL | Status: DC | PRN
  Administered 2011-10-12 – 2011-10-13 (×2): 5 mL via ORAL
  Filled 2011-10-10 (×3): qty 10

## 2011-10-10 MED ORDER — MORPHINE SULFATE 4 MG/ML IJ SOLN
4.0000 mg | Freq: Once | INTRAMUSCULAR | Status: AC
  Administered 2011-10-10: 4 mg via INTRAVENOUS
  Filled 2011-10-10: qty 1

## 2011-10-10 MED ORDER — ACETAMINOPHEN 650 MG RE SUPP: 650.0000 mg | Freq: Four times a day (QID) | RECTAL | Status: DC | PRN

## 2011-10-10 MED ORDER — MORPHINE SULFATE 2 MG/ML IJ SOLN
1.0000 mg | INTRAMUSCULAR | Status: DC | PRN
  Administered 2011-10-10 – 2011-10-12 (×7): 1 mg via INTRAVENOUS
  Filled 2011-10-10 (×7): qty 1

## 2011-10-10 MED ORDER — OXYCODONE HCL 20 MG PO TB12
20.0000 mg | ORAL_TABLET | Freq: Two times a day (BID) | ORAL | Status: DC
  Administered 2011-10-10 – 2011-10-15 (×9): 20 mg via ORAL
  Filled 2011-10-10 (×3): qty 1
  Filled 2011-10-10: qty 2
  Filled 2011-10-10 (×5): qty 1

## 2011-10-10 MED ORDER — OMEGA-3-ACID ETHYL ESTERS 1 G PO CAPS
1.0000 g | ORAL_CAPSULE | Freq: Every day | ORAL | Status: DC
  Administered 2011-10-11 – 2011-10-15 (×5): 1 g via ORAL
  Filled 2011-10-10 (×5): qty 1

## 2011-10-10 MED ORDER — SODIUM CHLORIDE 0.9 % IV BOLUS (SEPSIS)
1000.0000 mL | Freq: Once | INTRAVENOUS | Status: AC
  Administered 2011-10-10: 1000 mL via INTRAVENOUS

## 2011-10-10 MED ORDER — SODIUM CHLORIDE 0.9 % IV SOLN
INTRAVENOUS | Status: DC
  Administered 2011-10-11: 01:00:00 via INTRAVENOUS

## 2011-10-10 MED ORDER — ALBUTEROL SULFATE (5 MG/ML) 0.5% IN NEBU
2.5000 mg | INHALATION_SOLUTION | Freq: Four times a day (QID) | RESPIRATORY_TRACT | Status: DC
  Administered 2011-10-11: 2.5 mg via RESPIRATORY_TRACT
  Filled 2011-10-10: qty 0.5

## 2011-10-10 MED ORDER — ENOXAPARIN SODIUM 40 MG/0.4ML ~~LOC~~ SOLN
40.0000 mg | SUBCUTANEOUS | Status: DC
  Administered 2011-10-11 – 2011-10-12 (×2): 40 mg via SUBCUTANEOUS
  Filled 2011-10-10 (×2): qty 0.4

## 2011-10-10 MED ORDER — VANCOMYCIN HCL IN DEXTROSE 1-5 GM/200ML-% IV SOLN
1000.0000 mg | Freq: Once | INTRAVENOUS | Status: AC
  Administered 2011-10-10: 1000 mg via INTRAVENOUS
  Filled 2011-10-10: qty 200

## 2011-10-10 MED ORDER — OMEGA-3 FATTY ACIDS 1000 MG PO CAPS: 1.0000 g | ORAL_CAPSULE | Freq: Every day | ORAL | Status: DC

## 2011-10-10 MED ORDER — VITAMIN B-12 100 MCG PO TABS
100.0000 ug | ORAL_TABLET | Freq: Every day | ORAL | Status: DC
  Administered 2011-10-11 – 2011-10-15 (×5): 100 ug via ORAL
  Filled 2011-10-10 (×5): qty 1

## 2011-10-10 MED ORDER — ZOLPIDEM TARTRATE 5 MG PO TABS
5.0000 mg | ORAL_TABLET | Freq: Every evening | ORAL | Status: DC | PRN
  Administered 2011-10-13: 5 mg via ORAL
  Filled 2011-10-10: qty 1

## 2011-10-10 MED ORDER — OXYCODONE HCL 5 MG PO TABS
5.0000 mg | ORAL_TABLET | ORAL | Status: DC | PRN
  Administered 2011-10-11 – 2011-10-15 (×15): 5 mg via ORAL
  Filled 2011-10-10 (×15): qty 1

## 2011-10-10 MED ORDER — ADULT MULTIVITAMIN W/MINERALS CH
1.0000 | ORAL_TABLET | Freq: Every day | ORAL | Status: DC
  Administered 2011-10-11 – 2011-10-15 (×5): 1 via ORAL
  Filled 2011-10-10 (×5): qty 1

## 2011-10-10 MED ORDER — PIPERACILLIN-TAZOBACTAM 3.375 G IVPB
3.3750 g | Freq: Once | INTRAVENOUS | Status: AC
  Administered 2011-10-10: 3.375 g via INTRAVENOUS
  Filled 2011-10-10: qty 50
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
## 2011-10-10 NOTE — ED Notes (Signed)
Attempted to give report x 2. Attempted to give report to charge RN. Consulting civil engineer busy.
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
## 2011-10-10 NOTE — ED Notes (Signed)
Pt back from X-Ray.  

## 2011-10-10 NOTE — ED Notes (Signed)
Pt transported to XRay 

## 2011-10-11 ENCOUNTER — Encounter (HOSPITAL_COMMUNITY): Payer: Self-pay | Admitting: *Deleted

## 2011-10-11 LAB — BASIC METABOLIC PANEL
CO2: 25 mEq/L (ref 19–32)
Calcium: 8.7 mg/dL (ref 8.4–10.5)
Creatinine, Ser: 0.78 mg/dL (ref 0.50–1.10)
Glucose, Bld: 124 mg/dL — ABNORMAL HIGH (ref 70–99)

## 2011-10-11 LAB — CBC
Hemoglobin: 9.8 g/dL — ABNORMAL LOW (ref 12.0–15.0)
MCH: 25.7 pg — ABNORMAL LOW (ref 26.0–34.0)
MCHC: 31 g/dL (ref 30.0–36.0)
MCV: 82.7 fL (ref 78.0–100.0)
RBC: 3.82 MIL/uL — ABNORMAL LOW (ref 3.87–5.11)

## 2011-10-11 LAB — SEDIMENTATION RATE: Sed Rate: 103 mm/hr — ABNORMAL HIGH (ref 0–22)

## 2011-10-11 MED ORDER — INFLUENZA VIRUS VACC SPLIT PF IM SUSP
0.5000 mL | INTRAMUSCULAR | Status: AC
  Administered 2011-10-12: 0.5 mL via INTRAMUSCULAR
  Filled 2011-10-11: qty 0.5

## 2011-10-11 MED ORDER — VANCOMYCIN HCL 1000 MG IV SOLR
1500.0000 mg | INTRAVENOUS | Status: AC
  Administered 2011-10-11: 1500 mg via INTRAVENOUS
  Filled 2011-10-11: qty 1500

## 2011-10-11 MED ORDER — DEXTROSE 5 % IV SOLN
1.0000 g | Freq: Three times a day (TID) | INTRAVENOUS | Status: DC
  Administered 2011-10-11 – 2011-10-12 (×3): 1 g via INTRAVENOUS
  Filled 2011-10-11 (×5): qty 1

## 2011-10-11 MED ORDER — ALBUTEROL SULFATE (5 MG/ML) 0.5% IN NEBU: 2.5000 mg | INHALATION_SOLUTION | Freq: Four times a day (QID) | RESPIRATORY_TRACT | Status: DC | PRN

## 2011-10-11 MED ORDER — VANCOMYCIN HCL IN DEXTROSE 1-5 GM/200ML-% IV SOLN
1000.0000 mg | Freq: Two times a day (BID) | INTRAVENOUS | Status: DC
  Administered 2011-10-12: 1000 mg via INTRAVENOUS
  Filled 2011-10-11 (×3): qty 200

## 2011-10-11 MED ORDER — ALBUTEROL SULFATE HFA 108 (90 BASE) MCG/ACT IN AERS: 2.0000 | INHALATION_SPRAY | RESPIRATORY_TRACT | Status: DC | PRN

## 2011-10-11 MED ORDER — LEVOFLOXACIN IN D5W 750 MG/150ML IV SOLN
750.0000 mg | INTRAVENOUS | Status: AC
  Administered 2011-10-11 – 2011-10-13 (×3): 750 mg via INTRAVENOUS
  Filled 2011-10-11 (×3): qty 150

## 2011-10-11 MED ORDER — ACETAMINOPHEN 325 MG PO TABS
650.0000 mg | ORAL_TABLET | Freq: Four times a day (QID) | ORAL | Status: DC | PRN
  Administered 2011-10-11 (×2): 650 mg via ORAL
  Filled 2011-10-11 (×2): qty 2

## 2011-10-11 MED ORDER — ALBUTEROL SULFATE HFA 108 (90 BASE) MCG/ACT IN AERS
2.0000 | INHALATION_SPRAY | Freq: Three times a day (TID) | RESPIRATORY_TRACT | Status: DC
  Administered 2011-10-11 – 2011-10-12 (×4): 2 via RESPIRATORY_TRACT
  Filled 2011-10-11: qty 6.7
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
## 2011-10-12 DIAGNOSIS — D649 Anemia, unspecified: Secondary | ICD-10-CM

## 2011-10-12 DIAGNOSIS — E871 Hypo-osmolality and hyponatremia: Secondary | ICD-10-CM

## 2011-10-12 DIAGNOSIS — J189 Pneumonia, unspecified organism: Principal | ICD-10-CM

## 2011-10-12 LAB — CBC
HCT: 31.6 % — ABNORMAL LOW (ref 36.0–46.0)
Hemoglobin: 10 g/dL — ABNORMAL LOW (ref 12.0–15.0)
MCV: 82.1 fL (ref 78.0–100.0)
Platelets: 256 10*3/uL (ref 150–400)
RBC: 3.85 MIL/uL — ABNORMAL LOW (ref 3.87–5.11)
WBC: 8 10*3/uL (ref 4.0–10.5)

## 2011-10-12 LAB — BASIC METABOLIC PANEL
BUN: 5 mg/dL — ABNORMAL LOW (ref 6–23)
CO2: 24 mEq/L (ref 19–32)
Chloride: 98 mEq/L (ref 96–112)
Creatinine, Ser: 0.71 mg/dL (ref 0.50–1.10)

## 2011-10-12 LAB — URINE CULTURE

## 2011-10-12 MED ORDER — ENOXAPARIN SODIUM 60 MG/0.6ML ~~LOC~~ SOLN
50.0000 mg | SUBCUTANEOUS | Status: DC
  Administered 2011-10-13 – 2011-10-14 (×2): 50 mg via SUBCUTANEOUS
  Filled 2011-10-12 (×3): qty 0.6
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
## 2011-10-13 ENCOUNTER — Encounter (HOSPITAL_COMMUNITY): Payer: Self-pay | Admitting: Family Medicine

## 2011-10-13 LAB — BASIC METABOLIC PANEL
Calcium: 9 mg/dL (ref 8.4–10.5)
Chloride: 98 mEq/L (ref 96–112)
Creatinine, Ser: 0.83 mg/dL (ref 0.50–1.10)
GFR calc Af Amer: >90 mL/min (ref >90)

## 2011-10-13 MED ORDER — LEVOFLOXACIN 750 MG PO TABS
750.0000 mg | ORAL_TABLET | Freq: Every day | ORAL | Status: DC
  Administered 2011-10-14 – 2011-10-15 (×2): 750 mg via ORAL
  Filled 2011-10-13 (×2): qty 1

## 2011-10-13 NOTE — ED Provider Notes (Signed)
Medical screening examination/treatment/procedure(s) were performed by non-physician practitioner and as supervising physician I was immediately available for consultation/collaboration.   Uel Davidow E Jimmy Plessinger, MD 10/13/11 1640 

## 2011-10-13 NOTE — Progress Notes (Signed)
Angelique Blonder, workmen's comp rep, requesting [name] PT and MD Prog note sent to her via e-mail.  Sent these documents to [facility] via e-mail.  Providence Crosby, LCSWA Clinical Social Work [phone]

## 2011-10-13 NOTE — Progress Notes (Addendum)
Spoke with workmen's comp caseworker, Angelique Blonder, and updated her on [name] situation.  Angelique Blonder stated that, when Pt was d/c'd from the hospital in Sept, HHPT was the recommendation.  Pt was only sent to [facility] due to not having a safe environment in which to d/c.  Angelique Blonder stated that Pt was receiving PT services through [facility].  Angelique Blonder was confused as to why Pt doesn't want to go back to the ALF.  She stated that Pt has consistently told her that [facility] is a nice facility; Pt has had no complaints.  Angelique Blonder unsure if the company will authorize a new facility, as the company has already paid for [name] meds for the month at the ALF.  Denise to contact Pt and her superior to determine d/c.  CSW thanked her for her time.  Providence Crosby, LCSWA Clinical Social Work [phone]

## 2011-10-13 NOTE — Progress Notes (Signed)
Clinical Social Work Department CLINICAL SOCIAL WORK PLACEMENT NOTE 10/13/2011  Patient:  Anne Joyce, Anne Joyce  Account Number:  [id number] Admit date:  10/10/2011  Clinical Social Worker:  Doroteo Glassman  Date/time:  10/13/2011 11:37 AM  Clinical Social Work is seeking post-discharge placement for this patient at the following level of care:   SKILLED NURSING   (*CSW will update this form in Epic as items are completed)   Will provide once bed offers received Patient/family provided with [facility] Department of Clinical Social Work's list of facilities offering this level of care within the geographic area requested by the patient (or if unable, by the patient's family).  10/12/2011  Patient/family informed of their freedom to choose among providers that offer the needed level of care, that participate in Medicare, Medicaid or managed care program needed by the patient, have an available bed and are willing to accept the patient.  10/12/2011  Patient/family informed of [facility]' ownership interest in [facility], as well as of the fact that they are under no obligation to receive care at this facility.  PASARR submitted to EDS on 10/13/2011 PASARR number received from EDS on   FL2 transmitted to all facilities in geographic area requested by pt/family on  10/13/2011 FL2 transmitted to all facilities within larger geographic area on   Patient informed that his/her managed care company has contracts with or will negotiate with  certain facilities, including the following:     Patient/family informed of bed offers received:   Patient chooses bed at  Physician recommends and patient chooses bed at    Patient to be transferred to  on   Patient to be transferred to facility by   The following physician request were entered in Epic:   Additional Comments:  CSW to continue to follow.  Providence Crosby, LCSWA Clinical Social Work [phone]

## 2011-10-13 NOTE — Progress Notes (Addendum)
Facility stating that they have to have FL2 by 1530.    MD tending to an emergency in the ED and unable to d/c Pt by that time.  Pt to be d/c'd tomorrow.  Notified facility, Pt, RN and workmen's comp rep, Angelique Blonder.  Weekend CSW to facilitate d/c and will coordinate transportation with the workmen's comp transportation company.    CSW to contact the workmen's comp transportation group at [phone].  Do not use EMS.  Providence Crosby, LCSWA Clinical Social Work [phone]
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
## 2011-10-13 NOTE — Progress Notes (Signed)
Per Angelique Blonder, [name] workmen's comp rep, Pt will need to return to [facility] ALF, as they can adequately meet [name] needs.  Notified MD, Pt, RN and facility.  Transportation to be provided through [employer] transportation service: Mardelle Matte [phone] ext [phone]  Pt to be d/c'd.  Providence Crosby, LCSWA Clinical Social Work [phone]

## 2011-10-13 NOTE — Progress Notes (Signed)
Crystal, admissions RN at [facility], stating that they're not sure that they can accommodate her, based on the PT notes.    Crystal at [facility] to speak with workmen's comp rep, as well as the higher-ups at [facility], and notify weekend CSW if admission to their facility is appropriate.  Providence Crosby, LCSWA Clinical Social Work [phone]

## 2011-10-14 MED ORDER — LEVOFLOXACIN 750 MG PO TABS: 750.0000 mg | ORAL_TABLET | Freq: Every day | ORAL | Status: DC

## 2011-10-14 NOTE — Progress Notes (Signed)
CSW received a voice message from Ferrer Comunidad from [facility] stating that PT recommendations were that Pt would need HOB elevated and rails. Facility stated they do not have hospital beds and it would need to ordered for Monday.   Pt would be able to return on Monday.   CSW left a message for Clydie Braun (weekend contact) for clarification and is awaiting a return call. MD notified of delay.   CSW to follow.  Anne Joyce, LCSWA [employer] [phone]
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
## 2011-10-15 LAB — CULTURE, RESPIRATORY W GRAM STAIN: Culture: NORMAL

## 2011-10-15 MED ORDER — OXYCODONE HCL 5 MG PO TABS: 5.0000 mg | ORAL_TABLET | ORAL | Status: DC | PRN

## 2011-10-15 MED ORDER — LEVOFLOXACIN 750 MG PO TABS: 750.0000 mg | ORAL_TABLET | Freq: Every day | ORAL | Status: DC

## 2011-10-15 NOTE — Progress Notes (Signed)
CSW was contacted by Clydie Braun from [facility] with concern that the Pt needed 2 people to assist with ADL's and that the facility would not be able to keep the Pt. CSW explained that all the [name] information was sent to the facility prior to Pt leaving [facility] and that the FL2 did show that the Pt would need extensive assist for all ADL's except eating (which was limited assist). Clydie Braun from facility stated that the Pt was not at the same level as when she left and that she would contact her director for assistance as to what would need to occur at this point.   CSW then received a call from the director Autumn Patty) who explained that they would not be able to keep the Pt because the [facility] stated that the Pt needed SNF. CSW explained that originally when [facility] was completed the Pt was worked up for SNF, however once the facility, Research scientist (life sciences), and weekday CSW discussed d/c planning the Pt was cleared going to [facility] and change was  inadvertantly overlooked from SNF to ALF on FL2. CSW apologized, however reiterated that all other documentation was correct and stated Pt was to go to ALF facility with Extensive assist.  This CSW discussed d/c planning on several occassions d/c plan to facility, except in the event that the Pt need a Hospital bed to aid with getting up from supine. CSW discussed this concern with MD and Pt nurse and both were in agreement that Pt was at the same level of care as upon arrival. MD spoke with Pt to assess for the need for a hospital bed and Pt was in agreement that no hospital bed was needed and CSW proceeded with d/c to [facility] with acceptance by charge nurse Clydie Braun.   CSW Clinical Supervisor was able to speak with Mr. Sunny Schlein and  Facility requested a corrected FL2 be faxed (with signature) to facility; CSW faxed FL2.   No further needs at this time.   Leron Croak, LCSWA [employer] [phone]
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
## 2011-10-15 NOTE — Progress Notes (Signed)
At this time, she is taken back to [facility] via [place].  Report called to Clydie Braun at [facility].  Pt. In no distress and thanks [place] for our care.

## 2011-10-15 NOTE — Progress Notes (Signed)
Pt to be d/c today to [facility].  Pt and family agreeable. Confirmed plans with facility.  Plan transfer via EMS ordered through [employer] transportation  [phone]).  Leron Croak, LCSWA [employer]age [phone]

## 2011-10-17 LAB — CULTURE, BLOOD (ROUTINE X 2): Culture: NO GROWTH

## 2011-10-19 ENCOUNTER — Telehealth: Payer: Self-pay

## 2011-10-19 MED ORDER — FENTANYL 25 MCG/HR TD PT72: 1.0000 | MEDICATED_PATCH | TRANSDERMAL | Status: DC

## 2011-10-19 NOTE — Telephone Encounter (Signed)
Written order for fentanyl patch faxed to [facility]. Faxed [phone]

## 2011-11-22 ENCOUNTER — Ambulatory Visit: Payer: Worker's Compensation | Attending: Orthopedic Surgery

## 2011-11-22 DIAGNOSIS — M6281 Muscle weakness (generalized): Secondary | ICD-10-CM | POA: Insufficient documentation

## 2011-11-22 DIAGNOSIS — M25673 Stiffness of unspecified ankle, not elsewhere classified: Secondary | ICD-10-CM | POA: Insufficient documentation

## 2011-11-22 DIAGNOSIS — M25676 Stiffness of unspecified foot, not elsewhere classified: Secondary | ICD-10-CM | POA: Insufficient documentation

## 2011-11-22 DIAGNOSIS — R262 Difficulty in walking, not elsewhere classified: Secondary | ICD-10-CM | POA: Insufficient documentation

## 2011-11-22 DIAGNOSIS — IMO0001 Reserved for inherently not codable concepts without codable children: Secondary | ICD-10-CM | POA: Insufficient documentation

## 2011-11-22 DIAGNOSIS — M2550 Pain in unspecified joint: Secondary | ICD-10-CM | POA: Insufficient documentation

## 2011-11-24 ENCOUNTER — Ambulatory Visit: Payer: Worker's Compensation | Attending: Orthopedic Surgery

## 2011-11-24 DIAGNOSIS — M25673 Stiffness of unspecified ankle, not elsewhere classified: Secondary | ICD-10-CM | POA: Insufficient documentation

## 2011-11-24 DIAGNOSIS — IMO0001 Reserved for inherently not codable concepts without codable children: Secondary | ICD-10-CM | POA: Insufficient documentation

## 2011-11-24 DIAGNOSIS — R262 Difficulty in walking, not elsewhere classified: Secondary | ICD-10-CM | POA: Insufficient documentation

## 2011-11-24 DIAGNOSIS — M25676 Stiffness of unspecified foot, not elsewhere classified: Secondary | ICD-10-CM | POA: Insufficient documentation

## 2011-11-24 DIAGNOSIS — M6281 Muscle weakness (generalized): Secondary | ICD-10-CM | POA: Insufficient documentation

## 2011-11-24 DIAGNOSIS — M2550 Pain in unspecified joint: Secondary | ICD-10-CM | POA: Insufficient documentation

## 2011-11-27 ENCOUNTER — Ambulatory Visit: Payer: Worker's Compensation | Admitting: Physical Therapy

## 2011-11-29 ENCOUNTER — Ambulatory Visit: Payer: Worker's Compensation | Attending: Orthopedic Surgery | Admitting: Physical Therapy

## 2011-11-29 DIAGNOSIS — M6281 Muscle weakness (generalized): Secondary | ICD-10-CM | POA: Insufficient documentation

## 2011-11-29 DIAGNOSIS — R262 Difficulty in walking, not elsewhere classified: Secondary | ICD-10-CM | POA: Insufficient documentation

## 2011-11-29 DIAGNOSIS — M25673 Stiffness of unspecified ankle, not elsewhere classified: Secondary | ICD-10-CM | POA: Insufficient documentation

## 2011-11-29 DIAGNOSIS — IMO0001 Reserved for inherently not codable concepts without codable children: Secondary | ICD-10-CM | POA: Insufficient documentation

## 2011-11-29 DIAGNOSIS — M25676 Stiffness of unspecified foot, not elsewhere classified: Secondary | ICD-10-CM | POA: Insufficient documentation

## 2011-11-29 DIAGNOSIS — M2550 Pain in unspecified joint: Secondary | ICD-10-CM | POA: Insufficient documentation

## 2011-12-04 ENCOUNTER — Ambulatory Visit: Payer: Worker's Compensation | Attending: Orthopedic Surgery

## 2011-12-04 DIAGNOSIS — M25676 Stiffness of unspecified foot, not elsewhere classified: Secondary | ICD-10-CM | POA: Insufficient documentation

## 2011-12-04 DIAGNOSIS — R262 Difficulty in walking, not elsewhere classified: Secondary | ICD-10-CM | POA: Insufficient documentation

## 2011-12-04 DIAGNOSIS — M25673 Stiffness of unspecified ankle, not elsewhere classified: Secondary | ICD-10-CM | POA: Insufficient documentation

## 2011-12-04 DIAGNOSIS — IMO0001 Reserved for inherently not codable concepts without codable children: Secondary | ICD-10-CM | POA: Insufficient documentation

## 2011-12-04 DIAGNOSIS — M2550 Pain in unspecified joint: Secondary | ICD-10-CM | POA: Insufficient documentation

## 2011-12-04 DIAGNOSIS — M6281 Muscle weakness (generalized): Secondary | ICD-10-CM | POA: Insufficient documentation

## 2011-12-06 ENCOUNTER — Ambulatory Visit: Payer: Worker's Compensation | Attending: Orthopedic Surgery

## 2011-12-06 DIAGNOSIS — M25673 Stiffness of unspecified ankle, not elsewhere classified: Secondary | ICD-10-CM | POA: Insufficient documentation

## 2011-12-06 DIAGNOSIS — R262 Difficulty in walking, not elsewhere classified: Secondary | ICD-10-CM | POA: Insufficient documentation

## 2011-12-06 DIAGNOSIS — M6281 Muscle weakness (generalized): Secondary | ICD-10-CM | POA: Insufficient documentation

## 2011-12-06 DIAGNOSIS — M25676 Stiffness of unspecified foot, not elsewhere classified: Secondary | ICD-10-CM | POA: Insufficient documentation

## 2011-12-06 DIAGNOSIS — IMO0001 Reserved for inherently not codable concepts without codable children: Secondary | ICD-10-CM | POA: Insufficient documentation

## 2011-12-06 DIAGNOSIS — M2550 Pain in unspecified joint: Secondary | ICD-10-CM | POA: Insufficient documentation

## 2011-12-11 ENCOUNTER — Ambulatory Visit: Payer: Worker's Compensation | Attending: Orthopedic Surgery | Admitting: Physical Therapy

## 2011-12-11 DIAGNOSIS — M6281 Muscle weakness (generalized): Secondary | ICD-10-CM | POA: Insufficient documentation

## 2011-12-11 DIAGNOSIS — R262 Difficulty in walking, not elsewhere classified: Secondary | ICD-10-CM | POA: Insufficient documentation

## 2011-12-11 DIAGNOSIS — M25676 Stiffness of unspecified foot, not elsewhere classified: Secondary | ICD-10-CM | POA: Insufficient documentation

## 2011-12-11 DIAGNOSIS — M25673 Stiffness of unspecified ankle, not elsewhere classified: Secondary | ICD-10-CM | POA: Insufficient documentation

## 2011-12-11 DIAGNOSIS — M2550 Pain in unspecified joint: Secondary | ICD-10-CM | POA: Insufficient documentation

## 2011-12-11 DIAGNOSIS — IMO0001 Reserved for inherently not codable concepts without codable children: Secondary | ICD-10-CM | POA: Insufficient documentation

## 2011-12-13 ENCOUNTER — Ambulatory Visit: Payer: Worker's Compensation | Attending: Orthopedic Surgery

## 2011-12-13 DIAGNOSIS — R262 Difficulty in walking, not elsewhere classified: Secondary | ICD-10-CM | POA: Insufficient documentation

## 2011-12-13 DIAGNOSIS — M25676 Stiffness of unspecified foot, not elsewhere classified: Secondary | ICD-10-CM | POA: Insufficient documentation

## 2011-12-13 DIAGNOSIS — IMO0001 Reserved for inherently not codable concepts without codable children: Secondary | ICD-10-CM | POA: Insufficient documentation

## 2011-12-13 DIAGNOSIS — M2550 Pain in unspecified joint: Secondary | ICD-10-CM | POA: Insufficient documentation

## 2011-12-13 DIAGNOSIS — M25673 Stiffness of unspecified ankle, not elsewhere classified: Secondary | ICD-10-CM | POA: Insufficient documentation

## 2011-12-13 DIAGNOSIS — M6281 Muscle weakness (generalized): Secondary | ICD-10-CM | POA: Insufficient documentation

## 2011-12-18 ENCOUNTER — Ambulatory Visit: Payer: Worker's Compensation | Attending: Orthopedic Surgery

## 2011-12-18 DIAGNOSIS — M25673 Stiffness of unspecified ankle, not elsewhere classified: Secondary | ICD-10-CM | POA: Insufficient documentation

## 2011-12-18 DIAGNOSIS — M2550 Pain in unspecified joint: Secondary | ICD-10-CM | POA: Insufficient documentation

## 2011-12-18 DIAGNOSIS — M25676 Stiffness of unspecified foot, not elsewhere classified: Secondary | ICD-10-CM | POA: Insufficient documentation

## 2011-12-18 DIAGNOSIS — IMO0001 Reserved for inherently not codable concepts without codable children: Secondary | ICD-10-CM | POA: Insufficient documentation

## 2011-12-18 DIAGNOSIS — M6281 Muscle weakness (generalized): Secondary | ICD-10-CM | POA: Insufficient documentation

## 2011-12-18 DIAGNOSIS — R262 Difficulty in walking, not elsewhere classified: Secondary | ICD-10-CM | POA: Insufficient documentation

## 2011-12-20 ENCOUNTER — Ambulatory Visit: Payer: Worker's Compensation | Attending: Orthopedic Surgery

## 2011-12-20 DIAGNOSIS — IMO0001 Reserved for inherently not codable concepts without codable children: Secondary | ICD-10-CM | POA: Insufficient documentation

## 2011-12-20 DIAGNOSIS — M2550 Pain in unspecified joint: Secondary | ICD-10-CM | POA: Insufficient documentation

## 2011-12-20 DIAGNOSIS — M25673 Stiffness of unspecified ankle, not elsewhere classified: Secondary | ICD-10-CM | POA: Insufficient documentation

## 2011-12-20 DIAGNOSIS — R262 Difficulty in walking, not elsewhere classified: Secondary | ICD-10-CM | POA: Insufficient documentation

## 2011-12-20 DIAGNOSIS — M6281 Muscle weakness (generalized): Secondary | ICD-10-CM | POA: Insufficient documentation

## 2011-12-20 DIAGNOSIS — M25676 Stiffness of unspecified foot, not elsewhere classified: Secondary | ICD-10-CM | POA: Insufficient documentation

## 2011-12-25 ENCOUNTER — Ambulatory Visit: Payer: Worker's Compensation | Attending: Orthopedic Surgery | Admitting: Rehabilitation

## 2011-12-25 DIAGNOSIS — M25673 Stiffness of unspecified ankle, not elsewhere classified: Secondary | ICD-10-CM | POA: Insufficient documentation

## 2011-12-25 DIAGNOSIS — M25676 Stiffness of unspecified foot, not elsewhere classified: Secondary | ICD-10-CM | POA: Insufficient documentation

## 2011-12-25 DIAGNOSIS — M6281 Muscle weakness (generalized): Secondary | ICD-10-CM | POA: Insufficient documentation

## 2011-12-25 DIAGNOSIS — M2550 Pain in unspecified joint: Secondary | ICD-10-CM | POA: Insufficient documentation

## 2011-12-25 DIAGNOSIS — IMO0001 Reserved for inherently not codable concepts without codable children: Secondary | ICD-10-CM | POA: Insufficient documentation

## 2011-12-25 DIAGNOSIS — R262 Difficulty in walking, not elsewhere classified: Secondary | ICD-10-CM | POA: Insufficient documentation

## 2011-12-27 ENCOUNTER — Ambulatory Visit: Payer: Worker's Compensation | Admitting: Physical Therapy

## 2012-01-01 ENCOUNTER — Ambulatory Visit: Payer: Worker's Compensation | Admitting: Physical Therapy

## 2012-01-04 ENCOUNTER — Ambulatory Visit: Payer: Worker's Compensation

## 2012-01-08 ENCOUNTER — Ambulatory Visit: Payer: Worker's Compensation | Admitting: Physical Therapy

## 2012-01-15 ENCOUNTER — Ambulatory Visit: Payer: Worker's Compensation | Attending: Orthopedic Surgery | Admitting: Physical Therapy

## 2012-01-15 DIAGNOSIS — R262 Difficulty in walking, not elsewhere classified: Secondary | ICD-10-CM | POA: Insufficient documentation

## 2012-01-15 DIAGNOSIS — M2550 Pain in unspecified joint: Secondary | ICD-10-CM | POA: Insufficient documentation

## 2012-01-15 DIAGNOSIS — M6281 Muscle weakness (generalized): Secondary | ICD-10-CM | POA: Insufficient documentation

## 2012-01-15 DIAGNOSIS — M25673 Stiffness of unspecified ankle, not elsewhere classified: Secondary | ICD-10-CM | POA: Insufficient documentation

## 2012-01-15 DIAGNOSIS — IMO0001 Reserved for inherently not codable concepts without codable children: Secondary | ICD-10-CM | POA: Insufficient documentation

## 2012-01-15 DIAGNOSIS — M25676 Stiffness of unspecified foot, not elsewhere classified: Secondary | ICD-10-CM | POA: Insufficient documentation

## 2012-01-17 ENCOUNTER — Ambulatory Visit: Payer: Worker's Compensation

## 2012-01-22 ENCOUNTER — Ambulatory Visit: Payer: Worker's Compensation

## 2012-01-24 ENCOUNTER — Ambulatory Visit: Payer: Worker's Compensation | Admitting: Physical Therapy

## 2012-01-29 ENCOUNTER — Ambulatory Visit: Payer: Worker's Compensation

## 2012-01-31 ENCOUNTER — Ambulatory Visit: Payer: Worker's Compensation | Admitting: Physical Therapy

## 2012-02-05 ENCOUNTER — Ambulatory Visit: Payer: Worker's Compensation

## 2012-02-07 ENCOUNTER — Ambulatory Visit: Payer: Worker's Compensation

## 2012-02-12 ENCOUNTER — Ambulatory Visit: Payer: Worker's Compensation | Attending: Orthopedic Surgery | Admitting: Physical Therapy

## 2012-02-12 DIAGNOSIS — M6281 Muscle weakness (generalized): Secondary | ICD-10-CM | POA: Insufficient documentation

## 2012-02-12 DIAGNOSIS — M25676 Stiffness of unspecified foot, not elsewhere classified: Secondary | ICD-10-CM | POA: Insufficient documentation

## 2012-02-12 DIAGNOSIS — R262 Difficulty in walking, not elsewhere classified: Secondary | ICD-10-CM | POA: Insufficient documentation

## 2012-02-12 DIAGNOSIS — IMO0001 Reserved for inherently not codable concepts without codable children: Secondary | ICD-10-CM | POA: Insufficient documentation

## 2012-02-12 DIAGNOSIS — M25673 Stiffness of unspecified ankle, not elsewhere classified: Secondary | ICD-10-CM | POA: Insufficient documentation

## 2012-02-12 DIAGNOSIS — M2550 Pain in unspecified joint: Secondary | ICD-10-CM | POA: Insufficient documentation

## 2012-02-14 ENCOUNTER — Ambulatory Visit: Payer: Worker's Compensation | Admitting: Physical Therapy

## 2012-02-19 ENCOUNTER — Ambulatory Visit: Payer: Worker's Compensation | Admitting: Physical Therapy

## 2012-02-21 ENCOUNTER — Ambulatory Visit: Payer: Worker's Compensation | Admitting: Physical Therapy

## 2012-02-28 ENCOUNTER — Ambulatory Visit: Payer: Worker's Compensation | Attending: Orthopedic Surgery | Admitting: Physical Therapy

## 2012-02-28 DIAGNOSIS — R262 Difficulty in walking, not elsewhere classified: Secondary | ICD-10-CM | POA: Insufficient documentation

## 2012-02-28 DIAGNOSIS — M2550 Pain in unspecified joint: Secondary | ICD-10-CM | POA: Insufficient documentation

## 2012-02-28 DIAGNOSIS — M6281 Muscle weakness (generalized): Secondary | ICD-10-CM | POA: Insufficient documentation

## 2012-02-28 DIAGNOSIS — M25676 Stiffness of unspecified foot, not elsewhere classified: Secondary | ICD-10-CM | POA: Insufficient documentation

## 2012-02-28 DIAGNOSIS — M25673 Stiffness of unspecified ankle, not elsewhere classified: Secondary | ICD-10-CM | POA: Insufficient documentation

## 2012-02-28 DIAGNOSIS — IMO0001 Reserved for inherently not codable concepts without codable children: Secondary | ICD-10-CM | POA: Insufficient documentation

## 2012-03-04 ENCOUNTER — Ambulatory Visit: Payer: Worker's Compensation | Admitting: Physical Therapy

## 2012-03-06 ENCOUNTER — Ambulatory Visit: Payer: Worker's Compensation | Admitting: Physical Therapy

## 2012-03-11 ENCOUNTER — Ambulatory Visit: Payer: Worker's Compensation | Attending: Orthopedic Surgery

## 2012-03-11 DIAGNOSIS — M6281 Muscle weakness (generalized): Secondary | ICD-10-CM | POA: Insufficient documentation

## 2012-03-11 DIAGNOSIS — M2550 Pain in unspecified joint: Secondary | ICD-10-CM | POA: Insufficient documentation

## 2012-03-11 DIAGNOSIS — M25676 Stiffness of unspecified foot, not elsewhere classified: Secondary | ICD-10-CM | POA: Insufficient documentation

## 2012-03-11 DIAGNOSIS — IMO0001 Reserved for inherently not codable concepts without codable children: Secondary | ICD-10-CM | POA: Insufficient documentation

## 2012-03-11 DIAGNOSIS — M25673 Stiffness of unspecified ankle, not elsewhere classified: Secondary | ICD-10-CM | POA: Insufficient documentation

## 2012-03-11 DIAGNOSIS — R262 Difficulty in walking, not elsewhere classified: Secondary | ICD-10-CM | POA: Insufficient documentation

## 2012-03-13 ENCOUNTER — Ambulatory Visit: Payer: Worker's Compensation | Attending: Orthopedic Surgery | Admitting: Physical Therapy

## 2012-03-13 DIAGNOSIS — R262 Difficulty in walking, not elsewhere classified: Secondary | ICD-10-CM | POA: Insufficient documentation

## 2012-03-13 DIAGNOSIS — M2550 Pain in unspecified joint: Secondary | ICD-10-CM | POA: Insufficient documentation

## 2012-03-13 DIAGNOSIS — M25676 Stiffness of unspecified foot, not elsewhere classified: Secondary | ICD-10-CM | POA: Insufficient documentation

## 2012-03-13 DIAGNOSIS — IMO0001 Reserved for inherently not codable concepts without codable children: Secondary | ICD-10-CM | POA: Insufficient documentation

## 2012-03-13 DIAGNOSIS — M6281 Muscle weakness (generalized): Secondary | ICD-10-CM | POA: Insufficient documentation

## 2012-03-13 DIAGNOSIS — M25673 Stiffness of unspecified ankle, not elsewhere classified: Secondary | ICD-10-CM | POA: Insufficient documentation

## 2012-03-20 ENCOUNTER — Ambulatory Visit: Payer: Worker's Compensation | Attending: Orthopedic Surgery | Admitting: Physical Therapy

## 2012-03-20 DIAGNOSIS — M6281 Muscle weakness (generalized): Secondary | ICD-10-CM | POA: Insufficient documentation

## 2012-03-20 DIAGNOSIS — IMO0001 Reserved for inherently not codable concepts without codable children: Secondary | ICD-10-CM | POA: Insufficient documentation

## 2012-03-20 DIAGNOSIS — M25676 Stiffness of unspecified foot, not elsewhere classified: Secondary | ICD-10-CM | POA: Insufficient documentation

## 2012-03-20 DIAGNOSIS — M25673 Stiffness of unspecified ankle, not elsewhere classified: Secondary | ICD-10-CM | POA: Insufficient documentation

## 2012-03-20 DIAGNOSIS — M2550 Pain in unspecified joint: Secondary | ICD-10-CM | POA: Insufficient documentation

## 2012-03-20 DIAGNOSIS — R262 Difficulty in walking, not elsewhere classified: Secondary | ICD-10-CM | POA: Insufficient documentation

## 2012-03-22 ENCOUNTER — Ambulatory Visit: Payer: Worker's Compensation

## 2012-03-25 ENCOUNTER — Ambulatory Visit: Payer: Worker's Compensation

## 2012-03-27 ENCOUNTER — Ambulatory Visit: Payer: Worker's Compensation | Attending: Orthopedic Surgery | Admitting: Physical Therapy

## 2012-03-27 DIAGNOSIS — M2550 Pain in unspecified joint: Secondary | ICD-10-CM | POA: Insufficient documentation

## 2012-03-27 DIAGNOSIS — IMO0001 Reserved for inherently not codable concepts without codable children: Secondary | ICD-10-CM | POA: Insufficient documentation

## 2012-03-27 DIAGNOSIS — M6281 Muscle weakness (generalized): Secondary | ICD-10-CM | POA: Insufficient documentation

## 2012-03-27 DIAGNOSIS — M25673 Stiffness of unspecified ankle, not elsewhere classified: Secondary | ICD-10-CM | POA: Insufficient documentation

## 2012-03-27 DIAGNOSIS — M25676 Stiffness of unspecified foot, not elsewhere classified: Secondary | ICD-10-CM | POA: Insufficient documentation

## 2012-03-27 DIAGNOSIS — R262 Difficulty in walking, not elsewhere classified: Secondary | ICD-10-CM | POA: Insufficient documentation

## 2012-04-01 ENCOUNTER — Ambulatory Visit: Payer: Worker's Compensation | Attending: Orthopedic Surgery | Admitting: Physical Therapy

## 2012-04-01 DIAGNOSIS — IMO0001 Reserved for inherently not codable concepts without codable children: Secondary | ICD-10-CM | POA: Insufficient documentation

## 2012-04-01 DIAGNOSIS — M2550 Pain in unspecified joint: Secondary | ICD-10-CM | POA: Insufficient documentation

## 2012-04-01 DIAGNOSIS — M25676 Stiffness of unspecified foot, not elsewhere classified: Secondary | ICD-10-CM | POA: Insufficient documentation

## 2012-04-01 DIAGNOSIS — R262 Difficulty in walking, not elsewhere classified: Secondary | ICD-10-CM | POA: Insufficient documentation

## 2012-04-01 DIAGNOSIS — M6281 Muscle weakness (generalized): Secondary | ICD-10-CM | POA: Insufficient documentation

## 2012-04-01 DIAGNOSIS — M25673 Stiffness of unspecified ankle, not elsewhere classified: Secondary | ICD-10-CM | POA: Insufficient documentation

## 2012-04-02 ENCOUNTER — Telehealth: Payer: Self-pay

## 2012-04-02 NOTE — Telephone Encounter (Signed)
  Spoke with patient.  She is ready to schedule intake appointment to return to care.  Her name was given to me as a hospital follow up.   Appt scheduled for 04-04-12@ 9:00 am.   Laurell Josephs, RN

## 2012-04-03 ENCOUNTER — Ambulatory Visit: Payer: Worker's Compensation | Admitting: Physical Therapy

## 2012-04-04 ENCOUNTER — Ambulatory Visit: Payer: Self-pay

## 2012-04-08 ENCOUNTER — Ambulatory Visit: Payer: Worker's Compensation | Admitting: Physical Therapy

## 2012-04-11 ENCOUNTER — Other Ambulatory Visit: Payer: Self-pay | Admitting: Internal Medicine

## 2012-04-11 ENCOUNTER — Ambulatory Visit: Payer: BC Managed Care – PPO

## 2012-04-11 DIAGNOSIS — B20 Human immunodeficiency virus [HIV] disease: Secondary | ICD-10-CM

## 2012-04-11 LAB — CBC WITH DIFFERENTIAL/PLATELET
Basophils Relative: 0 % (ref 0–1)
Eosinophils Absolute: 0 10*3/uL (ref 0.0–0.7)
HCT: 34.4 % — ABNORMAL LOW (ref 36.0–46.0)
Hemoglobin: 11.4 g/dL — ABNORMAL LOW (ref 12.0–15.0)
Lymphs Abs: 2.5 10*3/uL (ref 0.7–4.0)
MCH: 28.8 pg (ref 26.0–34.0)
MCHC: 33.1 g/dL (ref 30.0–36.0)
MCV: 86.9 fL (ref 78.0–100.0)
Monocytes Absolute: 0.3 10*3/uL (ref 0.1–1.0)
Monocytes Relative: 7 % (ref 3–12)
RBC: 3.96 MIL/uL (ref 3.87–5.11)

## 2012-04-11 LAB — COMPLETE METABOLIC PANEL WITH GFR
Albumin: 4.2 g/dL (ref 3.5–5.2)
Alkaline Phosphatase: 74 U/L (ref 39–117)
BUN: 9 mg/dL (ref 6–23)
CO2: 27 mEq/L (ref 19–32)
GFR, Est African American: >89 mL/min
GFR, Est Non African American: 84 mL/min
Glucose, Bld: 91 mg/dL (ref 70–99)
Sodium: 136 mEq/L (ref 135–145)
Total Bilirubin: 0.3 mg/dL (ref 0.3–1.2)
Total Protein: 7.6 g/dL (ref 6.0–8.3)

## 2012-04-11 LAB — URINALYSIS
Bilirubin Urine: NEGATIVE
Glucose, UA: NEGATIVE mg/dL
Leukocytes, UA: NEGATIVE
pH: 6 (ref 5.0–8.0)

## 2012-04-11 LAB — HEPATITIS B SURFACE ANTIBODY,QUALITATIVE: Hep B S Ab: NONREACTIVE

## 2012-04-11 LAB — LIPID PANEL
Cholesterol: 202 mg/dL — ABNORMAL HIGH (ref 0–200)
HDL: 43 mg/dL (ref >39)
Total CHOL/HDL Ratio: 4.7 Ratio
VLDL: 19 mg/dL (ref 0–40)

## 2012-04-11 LAB — HEPATITIS B CORE ANTIBODY, TOTAL: Hep B Core Total Ab: POSITIVE — AB

## 2012-04-11 MED ORDER — CVS HAIR/SKIN/NAILS PO TABS: 100.0000 | ORAL_TABLET | Freq: Every day | ORAL | Status: DC

## 2012-04-11 MED ORDER — GINKGO BILOBA 40 MG PO TABS: 40.0000 | ORAL_TABLET | Freq: Every day | ORAL | Status: DC

## 2012-04-11 MED ORDER — ECHINACEA 125 MG PO CAPS: 125.0000 | ORAL_CAPSULE | Freq: Every day | ORAL | Status: DC

## 2012-04-11 NOTE — Addendum Note (Signed)
Addended by: Laurell Josephs on: 04/11/2012 11:28 AM   Modules accepted: Orders
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
## 2012-04-12 LAB — RPR

## 2012-04-25 ENCOUNTER — Encounter: Payer: Self-pay | Admitting: Internal Medicine

## 2012-04-25 ENCOUNTER — Ambulatory Visit (INDEPENDENT_AMBULATORY_CARE_PROVIDER_SITE_OTHER): Payer: BC Managed Care – PPO | Admitting: Internal Medicine

## 2012-04-25 VITALS — BP 157/81 | HR 68 | Temp 98.0°F | Ht 64.5 in | Wt 229.0 lb

## 2012-04-25 DIAGNOSIS — F4310 Post-traumatic stress disorder, unspecified: Secondary | ICD-10-CM

## 2012-04-25 LAB — HIV-1 GENOTYPR PLUS
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
## 2012-05-03 ENCOUNTER — Ambulatory Visit: Payer: Worker's Compensation | Attending: Orthopedic Surgery

## 2012-05-03 DIAGNOSIS — M6281 Muscle weakness (generalized): Secondary | ICD-10-CM | POA: Insufficient documentation

## 2012-05-03 DIAGNOSIS — R262 Difficulty in walking, not elsewhere classified: Secondary | ICD-10-CM | POA: Insufficient documentation

## 2012-05-03 DIAGNOSIS — M2550 Pain in unspecified joint: Secondary | ICD-10-CM | POA: Insufficient documentation

## 2012-05-03 DIAGNOSIS — IMO0001 Reserved for inherently not codable concepts without codable children: Secondary | ICD-10-CM | POA: Insufficient documentation

## 2012-05-03 DIAGNOSIS — M25673 Stiffness of unspecified ankle, not elsewhere classified: Secondary | ICD-10-CM | POA: Insufficient documentation

## 2012-05-03 DIAGNOSIS — M25676 Stiffness of unspecified foot, not elsewhere classified: Secondary | ICD-10-CM | POA: Insufficient documentation

## 2012-05-06 ENCOUNTER — Ambulatory Visit: Payer: Worker's Compensation | Attending: Orthopedic Surgery | Admitting: Physical Therapy

## 2012-05-06 DIAGNOSIS — M25676 Stiffness of unspecified foot, not elsewhere classified: Secondary | ICD-10-CM | POA: Insufficient documentation

## 2012-05-06 DIAGNOSIS — R262 Difficulty in walking, not elsewhere classified: Secondary | ICD-10-CM | POA: Insufficient documentation

## 2012-05-06 DIAGNOSIS — IMO0001 Reserved for inherently not codable concepts without codable children: Secondary | ICD-10-CM | POA: Insufficient documentation

## 2012-05-06 DIAGNOSIS — M25673 Stiffness of unspecified ankle, not elsewhere classified: Secondary | ICD-10-CM | POA: Insufficient documentation

## 2012-05-06 DIAGNOSIS — M2550 Pain in unspecified joint: Secondary | ICD-10-CM | POA: Insufficient documentation

## 2012-05-06 DIAGNOSIS — M6281 Muscle weakness (generalized): Secondary | ICD-10-CM | POA: Insufficient documentation

## 2012-05-08 ENCOUNTER — Ambulatory Visit: Payer: Worker's Compensation | Attending: Orthopedic Surgery | Admitting: Physical Therapy

## 2012-05-08 DIAGNOSIS — IMO0001 Reserved for inherently not codable concepts without codable children: Secondary | ICD-10-CM | POA: Insufficient documentation

## 2012-05-08 DIAGNOSIS — M2550 Pain in unspecified joint: Secondary | ICD-10-CM | POA: Insufficient documentation

## 2012-05-08 DIAGNOSIS — M25676 Stiffness of unspecified foot, not elsewhere classified: Secondary | ICD-10-CM | POA: Insufficient documentation

## 2012-05-08 DIAGNOSIS — M25673 Stiffness of unspecified ankle, not elsewhere classified: Secondary | ICD-10-CM | POA: Insufficient documentation

## 2012-05-08 DIAGNOSIS — R262 Difficulty in walking, not elsewhere classified: Secondary | ICD-10-CM | POA: Insufficient documentation

## 2012-05-08 DIAGNOSIS — M6281 Muscle weakness (generalized): Secondary | ICD-10-CM | POA: Insufficient documentation

## 2012-05-13 ENCOUNTER — Ambulatory Visit: Payer: Worker's Compensation | Attending: Orthopedic Surgery | Admitting: Physical Therapy

## 2012-05-13 DIAGNOSIS — M25673 Stiffness of unspecified ankle, not elsewhere classified: Secondary | ICD-10-CM | POA: Insufficient documentation

## 2012-05-13 DIAGNOSIS — R262 Difficulty in walking, not elsewhere classified: Secondary | ICD-10-CM | POA: Insufficient documentation

## 2012-05-13 DIAGNOSIS — M25676 Stiffness of unspecified foot, not elsewhere classified: Secondary | ICD-10-CM | POA: Insufficient documentation

## 2012-05-13 DIAGNOSIS — IMO0001 Reserved for inherently not codable concepts without codable children: Secondary | ICD-10-CM | POA: Insufficient documentation

## 2012-05-13 DIAGNOSIS — M2550 Pain in unspecified joint: Secondary | ICD-10-CM | POA: Insufficient documentation

## 2012-05-13 DIAGNOSIS — M6281 Muscle weakness (generalized): Secondary | ICD-10-CM | POA: Insufficient documentation

## 2012-05-14 ENCOUNTER — Other Ambulatory Visit: Payer: Self-pay

## 2012-05-14 DIAGNOSIS — Z1231 Encounter for screening mammogram for malignant neoplasm of breast: Secondary | ICD-10-CM

## 2012-05-15 ENCOUNTER — Ambulatory Visit: Payer: Worker's Compensation | Admitting: Physical Therapy

## 2012-05-15 ENCOUNTER — Encounter: Payer: Self-pay | Admitting: Physical Therapy

## 2012-05-20 ENCOUNTER — Ambulatory Visit: Payer: Worker's Compensation

## 2012-05-22 ENCOUNTER — Ambulatory Visit: Payer: Worker's Compensation

## 2012-05-28 ENCOUNTER — Ambulatory Visit: Payer: Self-pay | Admitting: Internal Medicine

## 2012-05-28 ENCOUNTER — Ambulatory Visit: Payer: Worker's Compensation | Admitting: Physical Therapy

## 2012-05-29 ENCOUNTER — Ambulatory Visit: Payer: Worker's Compensation | Admitting: Physical Therapy

## 2012-06-05 ENCOUNTER — Ambulatory Visit: Payer: Worker's Compensation | Admitting: Physical Therapy

## 2012-06-06 ENCOUNTER — Telehealth: Payer: Self-pay | Admitting: *Deleted

## 2012-06-06 ENCOUNTER — Ambulatory Visit: Payer: Self-pay | Admitting: Internal Medicine

## 2012-06-06 ENCOUNTER — Ambulatory Visit: Payer: Worker's Compensation | Attending: Orthopedic Surgery | Admitting: Physical Therapy

## 2012-06-06 DIAGNOSIS — IMO0001 Reserved for inherently not codable concepts without codable children: Secondary | ICD-10-CM | POA: Insufficient documentation

## 2012-06-06 NOTE — Telephone Encounter (Signed)
Left message requesting pt call [phone], option 1 to make a new appt.

## 2012-06-17 ENCOUNTER — Ambulatory Visit: Payer: Worker's Compensation | Attending: Orthopedic Surgery | Admitting: Physical Therapy

## 2012-06-17 DIAGNOSIS — IMO0001 Reserved for inherently not codable concepts without codable children: Secondary | ICD-10-CM | POA: Insufficient documentation

## 2012-06-18 ENCOUNTER — Ambulatory Visit
Admission: RE | Admit: 2012-06-18 | Discharge: 2012-06-18 | Disposition: A | Discharge status: Home / Self Care | Payer: BC Managed Care – PPO | Source: Ambulatory Visit

## 2012-06-18 DIAGNOSIS — Z1231 Encounter for screening mammogram for malignant neoplasm of breast: Secondary | ICD-10-CM

## 2012-06-19 ENCOUNTER — Ambulatory Visit: Payer: Worker's Compensation | Admitting: Physical Therapy

## 2012-06-24 ENCOUNTER — Ambulatory Visit: Payer: Worker's Compensation | Admitting: Physical Therapy

## 2012-06-26 ENCOUNTER — Ambulatory Visit: Payer: Worker's Compensation | Attending: Orthopedic Surgery | Admitting: Physical Therapy

## 2012-06-26 DIAGNOSIS — IMO0001 Reserved for inherently not codable concepts without codable children: Secondary | ICD-10-CM | POA: Insufficient documentation

## 2012-06-26 DIAGNOSIS — M25676 Stiffness of unspecified foot, not elsewhere classified: Secondary | ICD-10-CM | POA: Insufficient documentation

## 2012-06-26 DIAGNOSIS — R262 Difficulty in walking, not elsewhere classified: Secondary | ICD-10-CM | POA: Insufficient documentation

## 2012-06-26 DIAGNOSIS — M6281 Muscle weakness (generalized): Secondary | ICD-10-CM | POA: Insufficient documentation

## 2012-06-26 DIAGNOSIS — M25673 Stiffness of unspecified ankle, not elsewhere classified: Secondary | ICD-10-CM | POA: Insufficient documentation

## 2012-06-26 DIAGNOSIS — M2550 Pain in unspecified joint: Secondary | ICD-10-CM | POA: Insufficient documentation

## 2012-07-02 ENCOUNTER — Ambulatory Visit: Payer: Worker's Compensation | Admitting: Physical Therapy

## 2012-07-08 ENCOUNTER — Ambulatory Visit: Payer: Worker's Compensation | Attending: Orthopedic Surgery | Admitting: Physical Therapy

## 2012-07-08 DIAGNOSIS — R262 Difficulty in walking, not elsewhere classified: Secondary | ICD-10-CM | POA: Insufficient documentation

## 2012-07-08 DIAGNOSIS — M25673 Stiffness of unspecified ankle, not elsewhere classified: Secondary | ICD-10-CM | POA: Insufficient documentation

## 2012-07-08 DIAGNOSIS — M6281 Muscle weakness (generalized): Secondary | ICD-10-CM | POA: Insufficient documentation

## 2012-07-08 DIAGNOSIS — IMO0001 Reserved for inherently not codable concepts without codable children: Secondary | ICD-10-CM | POA: Insufficient documentation

## 2012-07-08 DIAGNOSIS — M2550 Pain in unspecified joint: Secondary | ICD-10-CM | POA: Insufficient documentation

## 2012-07-08 DIAGNOSIS — M25676 Stiffness of unspecified foot, not elsewhere classified: Secondary | ICD-10-CM | POA: Insufficient documentation

## 2012-07-10 ENCOUNTER — Encounter: Payer: Self-pay | Admitting: *Deleted

## 2012-07-10 ENCOUNTER — Ambulatory Visit: Payer: Worker's Compensation | Attending: Orthopedic Surgery | Admitting: Physical Therapy

## 2012-07-10 DIAGNOSIS — IMO0001 Reserved for inherently not codable concepts without codable children: Secondary | ICD-10-CM | POA: Insufficient documentation

## 2012-07-10 DIAGNOSIS — M2550 Pain in unspecified joint: Secondary | ICD-10-CM | POA: Insufficient documentation

## 2012-07-10 DIAGNOSIS — M25673 Stiffness of unspecified ankle, not elsewhere classified: Secondary | ICD-10-CM | POA: Insufficient documentation

## 2012-07-10 DIAGNOSIS — M6281 Muscle weakness (generalized): Secondary | ICD-10-CM | POA: Insufficient documentation

## 2012-07-10 DIAGNOSIS — R262 Difficulty in walking, not elsewhere classified: Secondary | ICD-10-CM | POA: Insufficient documentation

## 2012-07-10 DIAGNOSIS — M25676 Stiffness of unspecified foot, not elsewhere classified: Secondary | ICD-10-CM | POA: Insufficient documentation

## 2012-07-26 ENCOUNTER — Encounter (HOSPITAL_COMMUNITY): Payer: Self-pay | Admitting: Pharmacist

## 2012-07-27 ENCOUNTER — Other Ambulatory Visit: Payer: Self-pay | Admitting: Obstetrics and Gynecology

## 2012-08-01 ENCOUNTER — Inpatient Hospital Stay (HOSPITAL_COMMUNITY): Admission: RE | Admit: 2012-08-01 | Payer: Self-pay | Source: Ambulatory Visit

## 2012-08-01 ENCOUNTER — Encounter (HOSPITAL_COMMUNITY): Payer: Self-pay

## 2012-08-01 ENCOUNTER — Encounter (HOSPITAL_COMMUNITY)
Admission: RE | Admit: 2012-08-01 | Discharge: 2012-08-01 | Disposition: A | Discharge status: Home / Self Care | Payer: BC Managed Care – PPO | Source: Ambulatory Visit | Attending: Obstetrics and Gynecology | Admitting: Obstetrics and Gynecology

## 2012-08-01 DIAGNOSIS — Z01818 Encounter for other preprocedural examination: Secondary | ICD-10-CM | POA: Insufficient documentation

## 2012-08-01 DIAGNOSIS — Z01812 Encounter for preprocedural laboratory examination: Secondary | ICD-10-CM | POA: Insufficient documentation

## 2012-08-01 LAB — COMPREHENSIVE METABOLIC PANEL
AST: 17 U/L (ref 0–37)
Albumin: 3.6 g/dL (ref 3.5–5.2)
Chloride: 102 mEq/L (ref 96–112)
Creatinine, Ser: 0.83 mg/dL (ref 0.50–1.10)
Potassium: 4.2 mEq/L (ref 3.5–5.1)
Total Bilirubin: 0.2 mg/dL — ABNORMAL LOW (ref 0.3–1.2)
Total Protein: 8.4 g/dL — ABNORMAL HIGH (ref 6.0–8.3)

## 2012-08-01 LAB — URINALYSIS, ROUTINE W REFLEX MICROSCOPIC
Glucose, UA: NEGATIVE mg/dL
Hgb urine dipstick: NEGATIVE
Protein, ur: NEGATIVE mg/dL
pH: 6 (ref 5.0–8.0)

## 2012-08-01 LAB — CBC WITH DIFFERENTIAL/PLATELET
Eosinophils Relative: 0 % (ref 0–5)
HCT: 34.3 % — ABNORMAL LOW (ref 36.0–46.0)
Hemoglobin: 11 g/dL — ABNORMAL LOW (ref 12.0–15.0)
Lymphocytes Relative: 44 % (ref 12–46)
Lymphs Abs: 2.7 10*3/uL (ref 0.7–4.0)
MCV: 87.9 fL (ref 78.0–100.0)
Monocytes Absolute: 0.4 10*3/uL (ref 0.1–1.0)
Monocytes Relative: 6 % (ref 3–12)
RBC: 3.9 MIL/uL (ref 3.87–5.11)
WBC: 6.2 10*3/uL (ref 4.0–10.5)

## 2012-08-01 LAB — URINE MICROSCOPIC-ADD ON

## 2012-08-01 NOTE — Patient Instructions (Addendum)
20 Anne Joyce  08/01/2012   Your procedure is scheduled on:  08/07/12  Enter through the Main Entrance of [facility] at 6 AM.  Pick up the phone at the desk and dial [phone].   Call this number if you have problems the morning of surgery: [phone]   Remember:   Do not eat food:After Midnight.  Do not drink clear liquids: After Midnight.  Take these medicines the morning of surgery with A SIP OF WATER: na   Do not wear jewelry, make-up or nail polish.  Do not wear lotions, powders, or perfumes. You may wear deodorant.  Do not shave 48 hours prior to surgery.  Do not bring valuables to the hospital.  [facility] is not responsible                  for any belongings or valuables brought to the hospital.  Contacts, dentures or bridgework may not be worn into surgery.  Leave suitcase in the car. After surgery it may be brought to your room.  For patients admitted to the hospital, checkout time is 11:00 AM the day of                discharge.   Patients discharged the day of surgery will not be allowed to drive                   home.  Name and phone number of your driver: NA  Special Instructions: Shower using CHG 2 nights before surgery and the night before surgery.  If you shower the day of surgery use CHG.  Use special wash - you have one bottle of CHG for all showers.  You should use approximately 1/3 of the bottle for each shower.   Please read over the following fact sheets that you were given: Surgical Site Infection Prevention

## 2012-08-02 ENCOUNTER — Ambulatory Visit: Payer: BC Managed Care – PPO | Attending: Orthopedic Surgery

## 2012-08-02 DIAGNOSIS — M25676 Stiffness of unspecified foot, not elsewhere classified: Secondary | ICD-10-CM | POA: Insufficient documentation

## 2012-08-02 DIAGNOSIS — R262 Difficulty in walking, not elsewhere classified: Secondary | ICD-10-CM | POA: Insufficient documentation

## 2012-08-02 DIAGNOSIS — IMO0001 Reserved for inherently not codable concepts without codable children: Secondary | ICD-10-CM | POA: Insufficient documentation

## 2012-08-02 DIAGNOSIS — M2550 Pain in unspecified joint: Secondary | ICD-10-CM | POA: Insufficient documentation

## 2012-08-02 DIAGNOSIS — M25673 Stiffness of unspecified ankle, not elsewhere classified: Secondary | ICD-10-CM | POA: Insufficient documentation

## 2012-08-02 DIAGNOSIS — M6281 Muscle weakness (generalized): Secondary | ICD-10-CM | POA: Insufficient documentation

## 2012-08-06 MED ORDER — CEFAZOLIN SODIUM-DEXTROSE 2-3 GM-% IV SOLR
2.0000 g | INTRAVENOUS | Status: AC
  Administered 2012-08-07: 2 g via INTRAVENOUS
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
## 2012-08-07 ENCOUNTER — Encounter (HOSPITAL_COMMUNITY): Payer: Self-pay | Admitting: Anesthesiology

## 2012-08-07 ENCOUNTER — Inpatient Hospital Stay (HOSPITAL_COMMUNITY)
Admission: RE | Admit: 2012-08-07 | Discharge: 2012-08-09 | DRG: 359 | Disposition: A | Discharge status: Home / Self Care | Payer: BC Managed Care – PPO | Source: Ambulatory Visit | Attending: Obstetrics and Gynecology | Admitting: Obstetrics and Gynecology

## 2012-08-07 ENCOUNTER — Encounter (HOSPITAL_COMMUNITY)
Admission: RE | Disposition: A | Discharge status: Home / Self Care | Payer: Self-pay | Source: Ambulatory Visit | Attending: Obstetrics and Gynecology

## 2012-08-07 ENCOUNTER — Inpatient Hospital Stay (HOSPITAL_COMMUNITY): Payer: BC Managed Care – PPO | Admitting: Anesthesiology

## 2012-08-07 DIAGNOSIS — N920 Excessive and frequent menstruation with regular cycle: Secondary | ICD-10-CM | POA: Diagnosis present

## 2012-08-07 DIAGNOSIS — N760 Acute vaginitis: Secondary | ICD-10-CM | POA: Diagnosis present

## 2012-08-07 DIAGNOSIS — N852 Hypertrophy of uterus: Secondary | ICD-10-CM | POA: Diagnosis present

## 2012-08-07 DIAGNOSIS — D219 Benign neoplasm of connective and other soft tissue, unspecified: Secondary | ICD-10-CM

## 2012-08-07 DIAGNOSIS — Z21 Asymptomatic human immunodeficiency virus [HIV] infection status: Secondary | ICD-10-CM | POA: Diagnosis present

## 2012-08-07 DIAGNOSIS — D252 Subserosal leiomyoma of uterus: Secondary | ICD-10-CM | POA: Diagnosis present

## 2012-08-07 DIAGNOSIS — N8000 Endometriosis of the uterus, unspecified: Secondary | ICD-10-CM | POA: Diagnosis present

## 2012-08-07 DIAGNOSIS — N840 Polyp of corpus uteri: Secondary | ICD-10-CM | POA: Diagnosis present

## 2012-08-07 DIAGNOSIS — A499 Bacterial infection, unspecified: Secondary | ICD-10-CM | POA: Diagnosis present

## 2012-08-07 DIAGNOSIS — B9689 Other specified bacterial agents as the cause of diseases classified elsewhere: Secondary | ICD-10-CM | POA: Diagnosis present

## 2012-08-07 DIAGNOSIS — B20 Human immunodeficiency virus [HIV] disease: Secondary | ICD-10-CM

## 2012-08-07 DIAGNOSIS — D251 Intramural leiomyoma of uterus: Secondary | ICD-10-CM | POA: Diagnosis present

## 2012-08-07 DIAGNOSIS — D250 Submucous leiomyoma of uterus: Principal | ICD-10-CM | POA: Diagnosis present

## 2012-08-07 HISTORY — PX: ABDOMINAL HYSTERECTOMY: SHX81

## 2012-08-07 LAB — CBC
MCH: 28.3 pg (ref 26.0–34.0)
MCV: 88.5 fL (ref 78.0–100.0)
Platelets: 260 10*3/uL (ref 150–400)
RBC: 3.82 MIL/uL — ABNORMAL LOW (ref 3.87–5.11)
RDW: 14.2 % (ref 11.5–15.5)
WBC: 11.5 10*3/uL — ABNORMAL HIGH (ref 4.0–10.5)

## 2012-08-07 LAB — CREATININE, SERUM
Creatinine, Ser: 0.74 mg/dL (ref 0.50–1.10)
GFR calc Af Amer: >90 mL/min (ref >90)

## 2012-08-07 LAB — PREGNANCY, URINE: Preg Test, Ur: NEGATIVE

## 2012-08-07 SURGERY — HYSTERECTOMY, ABDOMINAL
Anesthesia: General | Site: Abdomen | Wound class: Clean Contaminated

## 2012-08-07 MED ORDER — ROCURONIUM BROMIDE 100 MG/10ML IV SOLN
INTRAVENOUS | Status: DC | PRN
  Administered 2012-08-07: 60 mg via INTRAVENOUS
  Administered 2012-08-07: 10 mg via INTRAVENOUS

## 2012-08-07 MED ORDER — OXYCODONE HCL 5 MG PO TABS: 5.0000 mg | ORAL_TABLET | ORAL | Status: DC | PRN

## 2012-08-07 MED ORDER — 0.9 % SODIUM CHLORIDE (POUR BTL) OPTIME
TOPICAL | Status: DC | PRN
  Administered 2012-08-07 (×2): 1000 mL

## 2012-08-07 MED ORDER — HYDROMORPHONE HCL PF 1 MG/ML IJ SOLN
0.2500 mg | INTRAMUSCULAR | Status: DC | PRN
  Administered 2012-08-07 (×2): 0.5 mg via INTRAVENOUS

## 2012-08-07 MED ORDER — PROPOFOL 10 MG/ML IV BOLUS
INTRAVENOUS | Status: DC | PRN
  Administered 2012-08-07: 200 mg via INTRAVENOUS

## 2012-08-07 MED ORDER — OMEGA-3-ACID ETHYL ESTERS 1 G PO CAPS
1.0000 g | ORAL_CAPSULE | Freq: Every day | ORAL | Status: DC
  Administered 2012-08-08 – 2012-08-09 (×2): 1 g via ORAL
  Filled 2012-08-07 (×2): qty 1

## 2012-08-07 MED ORDER — ECHINACEA 80 MG PO CAPS: ORAL_CAPSULE | Freq: Every morning | ORAL | Status: DC

## 2012-08-07 MED ORDER — ONDANSETRON HCL 4 MG/2ML IJ SOLN
INTRAMUSCULAR | Status: DC | PRN
  Administered 2012-08-07: 4 mg via INTRAVENOUS

## 2012-08-07 MED ORDER — OXYCODONE-ACETAMINOPHEN 5-325 MG PO TABS: 1.0000 | ORAL_TABLET | Freq: Four times a day (QID) | ORAL | Status: DC | PRN

## 2012-08-07 MED ORDER — ONDANSETRON HCL 4 MG/2ML IJ SOLN: 4.0000 mg | Freq: Four times a day (QID) | INTRAMUSCULAR | Status: DC | PRN

## 2012-08-07 MED ORDER — ONDANSETRON HCL 4 MG/2ML IJ SOLN
INTRAMUSCULAR | Status: AC
  Filled 2012-08-07: qty 2

## 2012-08-07 MED ORDER — HEPARIN SODIUM (PORCINE) 5000 UNIT/ML IJ SOLN: 5000.0000 [IU] | INTRAMUSCULAR | Status: AC

## 2012-08-07 MED ORDER — ONDANSETRON HCL 4 MG PO TABS: 4.0000 mg | ORAL_TABLET | Freq: Four times a day (QID) | ORAL | Status: DC | PRN

## 2012-08-07 MED ORDER — ADULT MULTIVITAMIN W/MINERALS CH
1.0000 | ORAL_TABLET | Freq: Every day | ORAL | Status: DC
  Administered 2012-08-08 – 2012-08-09 (×2): 1 via ORAL
  Filled 2012-08-07 (×2): qty 1

## 2012-08-07 MED ORDER — VITAMIN B-12 100 MCG PO TABS
50.0000 ug | ORAL_TABLET | Freq: Every day | ORAL | Status: DC
  Administered 2012-08-08 – 2012-08-09 (×2): 50 ug via ORAL
  Filled 2012-08-07 (×2): qty 1

## 2012-08-07 MED ORDER — HYDROMORPHONE HCL PF 1 MG/ML IJ SOLN
INTRAMUSCULAR | Status: DC | PRN
  Administered 2012-08-07: 1 mg via INTRAVENOUS

## 2012-08-07 MED ORDER — HEPARIN SODIUM (PORCINE) 5000 UNIT/ML IJ SOLN
INTRAMUSCULAR | Status: AC
  Administered 2012-08-07: 5000 [IU] via SUBCUTANEOUS
  Filled 2012-08-07: qty 1

## 2012-08-07 MED ORDER — HYDROMORPHONE HCL PF 1 MG/ML IJ SOLN
INTRAMUSCULAR | Status: AC
  Filled 2012-08-07: qty 1

## 2012-08-07 MED ORDER — PROPOFOL 10 MG/ML IV EMUL
INTRAVENOUS | Status: AC
  Filled 2012-08-07: qty 20

## 2012-08-07 MED ORDER — CEFAZOLIN SODIUM-DEXTROSE 2-3 GM-% IV SOLR
INTRAVENOUS | Status: AC
  Filled 2012-08-07: qty 50

## 2012-08-07 MED ORDER — GLYCOPYRROLATE 0.2 MG/ML IJ SOLN
INTRAMUSCULAR | Status: DC | PRN
  Administered 2012-08-07: 0.4 mg via INTRAVENOUS

## 2012-08-07 MED ORDER — LACTATED RINGERS IV SOLN
INTRAVENOUS | Status: DC
  Administered 2012-08-07 (×3): via INTRAVENOUS

## 2012-08-07 MED ORDER — MIDAZOLAM HCL 5 MG/5ML IJ SOLN
INTRAMUSCULAR | Status: DC | PRN
  Administered 2012-08-07: 2 mg via INTRAVENOUS

## 2012-08-07 MED ORDER — IRON 66 MG PO TABS: ORAL_TABLET | Freq: Every morning | ORAL | Status: DC

## 2012-08-07 MED ORDER — METOCLOPRAMIDE HCL 5 MG/ML IJ SOLN: 10.0000 mg | Freq: Once | INTRAMUSCULAR | Status: DC | PRN

## 2012-08-07 MED ORDER — LACTATED RINGERS IV SOLN
INTRAVENOUS | Status: AC
  Administered 2012-08-07 – 2012-08-08 (×2): via INTRAVENOUS

## 2012-08-07 MED ORDER — FERROUS SULFATE 325 (65 FE) MG PO TABS
325.0000 mg | ORAL_TABLET | Freq: Every day | ORAL | Status: DC
  Administered 2012-08-08 – 2012-08-09 (×2): 325 mg via ORAL
  Filled 2012-08-07 (×2): qty 1

## 2012-08-07 MED ORDER — DEXAMETHASONE SODIUM PHOSPHATE 10 MG/ML IJ SOLN
INTRAMUSCULAR | Status: DC | PRN
  Administered 2012-08-07: 10 mg via INTRAVENOUS

## 2012-08-07 MED ORDER — GLYCOPYRROLATE 0.2 MG/ML IJ SOLN
INTRAMUSCULAR | Status: AC
  Filled 2012-08-07: qty 2

## 2012-08-07 MED ORDER — CALCIUM 500 MG PO TABS
500.0000 mg | ORAL_TABLET | Freq: Every morning | ORAL | Status: DC
  Administered 2012-08-08 – 2012-08-09 (×2): 500 mg via ORAL
  Filled 2012-08-07 (×2): qty 1

## 2012-08-07 MED ORDER — SODIUM CHLORIDE 0.9 % IJ SOLN: 9.0000 mL | INTRAMUSCULAR | Status: DC | PRN

## 2012-08-07 MED ORDER — FENTANYL CITRATE 0.05 MG/ML IJ SOLN
INTRAMUSCULAR | Status: DC | PRN
  Administered 2012-08-07: 50 ug via INTRAVENOUS
  Administered 2012-08-07: 150 ug via INTRAVENOUS
  Administered 2012-08-07: 100 ug via INTRAVENOUS
  Administered 2012-08-07 (×2): 50 ug via INTRAVENOUS

## 2012-08-07 MED ORDER — ROCURONIUM BROMIDE 50 MG/5ML IV SOLN
INTRAVENOUS | Status: AC
  Filled 2012-08-07: qty 1

## 2012-08-07 MED ORDER — NEOSTIGMINE METHYLSULFATE 1 MG/ML IJ SOLN
INTRAMUSCULAR | Status: AC
  Filled 2012-08-07: qty 1

## 2012-08-07 MED ORDER — LIDOCAINE HCL (CARDIAC) 20 MG/ML IV SOLN
INTRAVENOUS | Status: AC
  Filled 2012-08-07: qty 5

## 2012-08-07 MED ORDER — CEFAZOLIN SODIUM-DEXTROSE 2-3 GM-% IV SOLR
2.0000 g | Freq: Three times a day (TID) | INTRAVENOUS | Status: AC
  Administered 2012-08-07 (×2): 2 g via INTRAVENOUS
  Filled 2012-08-07 (×2): qty 50

## 2012-08-07 MED ORDER — MIDAZOLAM HCL 2 MG/2ML IJ SOLN
INTRAMUSCULAR | Status: AC
  Filled 2012-08-07: qty 2

## 2012-08-07 MED ORDER — NALOXONE HCL 0.4 MG/ML IJ SOLN: 0.4000 mg | INTRAMUSCULAR | Status: DC | PRN

## 2012-08-07 MED ORDER — DEXAMETHASONE SODIUM PHOSPHATE 10 MG/ML IJ SOLN
INTRAMUSCULAR | Status: AC
  Filled 2012-08-07: qty 1

## 2012-08-07 MED ORDER — DIPHENHYDRAMINE HCL 50 MG/ML IJ SOLN: 12.5000 mg | Freq: Four times a day (QID) | INTRAMUSCULAR | Status: DC | PRN

## 2012-08-07 MED ORDER — FENTANYL CITRATE 0.05 MG/ML IJ SOLN
INTRAMUSCULAR | Status: AC
  Filled 2012-08-07: qty 5

## 2012-08-07 MED ORDER — NEOSTIGMINE METHYLSULFATE 1 MG/ML IJ SOLN
INTRAMUSCULAR | Status: DC | PRN
  Administered 2012-08-07: 3 mg via INTRAVENOUS

## 2012-08-07 MED ORDER — GINKGO BILOBA 40 MG PO TABS: 40.0000 | ORAL_TABLET | Freq: Every day | ORAL | Status: DC

## 2012-08-07 MED ORDER — HEPARIN SODIUM (PORCINE) 5000 UNIT/ML IJ SOLN
5000.0000 [IU] | Freq: Three times a day (TID) | INTRAMUSCULAR | Status: DC
  Administered 2012-08-07 – 2012-08-09 (×6): 5000 [IU] via SUBCUTANEOUS
  Filled 2012-08-07 (×7): qty 1

## 2012-08-07 MED ORDER — HYDROMORPHONE 0.3 MG/ML IV SOLN
INTRAVENOUS | Status: DC
  Administered 2012-08-07: 12:00:00 via INTRAVENOUS
  Administered 2012-08-07: 0.9 mg via INTRAVENOUS
  Administered 2012-08-07: 1.19 mg via INTRAVENOUS
  Administered 2012-08-07: 0.399 mg via INTRAVENOUS
  Administered 2012-08-08: 0.8 mg via INTRAVENOUS
  Administered 2012-08-08: 0.999 mg via INTRAVENOUS
  Filled 2012-08-07: qty 25

## 2012-08-07 MED ORDER — HAIR/SKIN/NAILS PO TABS: ORAL_TABLET | Freq: Every morning | ORAL | Status: DC

## 2012-08-07 MED ORDER — LIDOCAINE HCL (CARDIAC) 20 MG/ML IV SOLN
INTRAVENOUS | Status: DC | PRN
  Administered 2012-08-07: 100 mg via INTRAVENOUS

## 2012-08-07 MED ORDER — MEPERIDINE HCL 25 MG/ML IJ SOLN: 6.2500 mg | INTRAMUSCULAR | Status: DC | PRN

## 2012-08-07 MED ORDER — DIPHENHYDRAMINE HCL 12.5 MG/5ML PO ELIX: 12.5000 mg | ORAL_SOLUTION | Freq: Four times a day (QID) | ORAL | Status: DC | PRN

## 2012-08-07 MED ORDER — OMEGA-3 FATTY ACIDS 1000 MG PO CAPS: 1.0000 g | ORAL_CAPSULE | Freq: Every day | ORAL | Status: DC

## 2012-08-07 SURGICAL SUPPLY — 30 items
CANISTER SUCTION 2500CC (MISCELLANEOUS) ×3 IMPLANT
CLOTH BEACON ORANGE TIMEOUT ST (SAFETY) ×3 IMPLANT
CONT PATH 16OZ SNAP LID 3702 (MISCELLANEOUS) ×3 IMPLANT
DECANTER SPIKE VIAL GLASS SM (MISCELLANEOUS) IMPLANT
DRSG VASELINE 3X18 (GAUZE/BANDAGES/DRESSINGS) ×3 IMPLANT
GAUZE SPONGE 4X4 12PLY STRL LF (GAUZE/BANDAGES/DRESSINGS) ×3 IMPLANT
GAUZE VASELINE 3X9 (GAUZE/BANDAGES/DRESSINGS) ×2 IMPLANT
GLOVE BIO SURGEON STRL SZ7.5 (GLOVE) ×3 IMPLANT
GOWN PREVENTION PLUS LG XLONG (DISPOSABLE) ×2 IMPLANT
GOWN PREVENTION PLUS XLARGE (GOWN DISPOSABLE) ×1 IMPLANT
NS IRRIG 1000ML POUR BTL (IV SOLUTION) ×3 IMPLANT
PACK ABDOMINAL GYN (CUSTOM PROCEDURE TRAY) ×3 IMPLANT
PAD OB MATERNITY 4.3X12.25 (PERSONAL CARE ITEMS) ×3 IMPLANT
PROTECTOR NERVE ULNAR (MISCELLANEOUS) ×3 IMPLANT
SPONGE LAP 18X18 X RAY DECT (DISPOSABLE) ×6 IMPLANT
SPONGE LAP 4X18 X RAY DECT (DISPOSABLE) ×3 IMPLANT
STAPLER VISISTAT 35W (STAPLE) ×3 IMPLANT
SUT PDS AB 0 CTX 60 (SUTURE) IMPLANT
SUT VIC AB 0 CT1 18XCR BRD8 (SUTURE) ×6 IMPLANT
SUT VIC AB 0 CT1 27 (SUTURE) ×9
SUT VIC AB 0 CT1 27XBRD ANBCTR (SUTURE) ×6 IMPLANT
SUT VIC AB 0 CT1 8-18 (SUTURE) ×9
SUT VIC AB 3-0 CTX 36 (SUTURE) ×3 IMPLANT
SUT VIC AB 3-0 SH 27 (SUTURE) ×3
SUT VIC AB 3-0 SH 27X BRD (SUTURE) ×1 IMPLANT
SUT VICRYL 0 TIES 12 18 (SUTURE) ×3 IMPLANT
TAPE CLOTH SURG 4X10 WHT LF (GAUZE/BANDAGES/DRESSINGS) ×2 IMPLANT
TOWEL OR 17X24 6PK STRL BLUE (TOWEL DISPOSABLE) ×6 IMPLANT
TRAY FOLEY CATH 14FR (SET/KITS/TRAYS/PACK) ×3 IMPLANT
WATER STERILE IRR 1000ML POUR (IV SOLUTION) ×1 IMPLANT
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
## 2012-08-07 NOTE — Progress Notes (Signed)
PHARMACIST - PHYSICIAN ORDER COMMUNICATION  CONCERNING: P&T Medication Policy on Herbal Medications  DESCRIPTION:  This patient's order for: Echinacea, Hair/Skin/Nails, and Ginkgo Biloba  has been noted.  This product(s) is classified as an "herbal" or natural product. Due to a lack of definitive safety studies or FDA approval, nonstandard manufacturing practices, plus the potential risk of unknown drug-drug interactions while on inpatient medications, the Pharmacy and Therapeutics Committee does not permit the use of "herbal" or natural products of this type within [facility].   ACTION TAKEN: The pharmacy department is unable to verify this order at this time and your patient has been informed of this safety policy. Please reevaluate patient's clinical condition at discharge and address if the herbal or natural product(s) should be resumed at that time.  Sherrita Riederer, Layla Maw

## 2012-08-07 NOTE — Preoperative (Signed)
Beta Blockers   Reason not to administer Beta Blockers:Not Applicable 

## 2012-08-07 NOTE — Anesthesia Postprocedure Evaluation (Signed)
  Anesthesia Post-op Note  Patient: Anne Joyce  Procedure(s) Performed: Procedure(s): HYSTERECTOMY ABDOMINAL (N/A)  Patient Location: [facility]  Anesthesia Type:General  Level of Consciousness: awake and oriented  Airway and Oxygen Therapy: Patient Spontanous Breathing and Patient connected to nasal cannula oxygen  Post-op Pain: moderate  Post-op Assessment: Post-op Vital signs reviewed and Patient's Cardiovascular Status Stable  Post-op Vital Signs: Reviewed and stable  Complications: No apparent anesthesia complications 

## 2012-08-07 NOTE — Op Note (Signed)
Anne Joyce, Anne Joyce              ACCOUNT NO.:  [id number]  MEDICAL RECORD NO.:  [id number]  LOCATION:  [facility]                          FACILITY:  [facility]  PHYSICIAN:  Malachi Pro. Ambrose Mantle, M.D. DATE OF BIRTH:  Mar 13, 1965  DATE OF PROCEDURE:  08/07/2012 DATE OF DISCHARGE:                              OPERATIVE REPORT   PREOPERATIVE DIAGNOSIS:  Leiomyomata uteri, menorrhagia.  POSTOPERATIVE DIAGNOSIS:  Leiomyomata uteri, menorrhagia.  OPERATION:  Abdominal hysterectomy.  OPERATOR:  Malachi Pro. Ambrose Mantle, M.D.  ASSISTANT:  Zenaida Niece, M.D.  General anesthesia.  The patient was brought to the operating room, and was cared for with universal precautions as always but the staff was notified that she was HIV positive.  The patient was placed under satisfactory general anesthesia.  After anesthesia was instituted, I prepped the abdomen, vulva, vagina, and urethra were prepped with Betadine solution and inserted a Foley catheter.  I then palpated the abdomen to see if the uterus was removed and it did seem to be movable, so I thought I could remove the uterus through a transverse incision.  I draped the abdomen as a sterile field and entered the abdomen through the skin, subcutaneous tissue, and fascia.  The fascia was separated from the rectus muscles superiorly and inferiorly, and the peritoneum was opened vertically.  Exploration of the abdomen revealed that the fibroids were about 20-week size, but I was able to manipulate them through the incisional opening without a retractor, placed clamps across the upper pedicles, divided the round ligaments bilaterally with the Bovie, divided the utero-ovarian ligaments doubly suture ligated them.  Then, clamped down the sides of the uterus, suture ligated with 0 Vicryl, until I reached the uterosacral ligaments.  I held the sutures on those. I entered the vaginal angles bilaterally, removed the uterus weight over 1000 g, closed the vagina,  sutured the uterosacral ligaments together in the midline.  Palpated the ureters, found them to be normal size and Caliber.The tubes had been previously ligated but otherwise were normal and the ovaries were normal.  There was some bleeding from the left mesovarian.  I sutured it with 0 Vicryl.  Obtained complete hemostasis.  Liberally irrigated. Palpated the gallbladder was normal.  The kidneys felt normal.  The 1 pack I had used was removed and the abdominal wall was closed in layers using interrupted sutures of 0 Vicryl to close the rectus muscle and peritoneum.  Two running sutures of 0 Vicryl on the fascia.  Running 3-0 Vicryl on the subcutaneous tissue and staples on the skin.  The patient seemed to tolerate the procedure well.  Blood loss about 300 mL.  Sponge and needle counts were correct.     Malachi Pro. Ambrose Mantle, M.D.     TFH/MEDQ  D:  08/07/2012  T:  08/07/2012  Job:  [id number]

## 2012-08-07 NOTE — Progress Notes (Signed)
Patient ID: Anne Joyce, female   DOB: 09-Mar-1965, 47 y.o.   MRN: [id number]  PON s/p TAH  Doing well.  No N/V  AFVSS (BP mildly elevated 130-140/70-85) gen NAD CV RRR Lungs CTAB Abd soft, app tender Inc bandage intact Ext sym, NT  46yo s/p TAH, 20 wk size uterus, doing well.  Routine care.

## 2012-08-07 NOTE — Progress Notes (Signed)
Patient ID: Anne Joyce, female   DOB: [birth year], 47 y.o.   MRN: [id number] Pt was examined 07-31-12 and she reports no change in her health since that time.

## 2012-08-07 NOTE — Anesthesia Postprocedure Evaluation (Signed)
  Anesthesia Post-op Note  Patient: Anne Joyce  Procedure(s) Performed: Procedure(s): HYSTERECTOMY ABDOMINAL (N/A)  Patient Location: [facility]  Anesthesia Type:General  Level of Consciousness: awake and oriented  Airway and Oxygen Therapy: Patient Spontanous Breathing and Patient connected to nasal cannula oxygen  Post-op Pain: moderate  Post-op Assessment: Post-op Vital signs reviewed and Patient's Cardiovascular Status Stable  Post-op Vital Signs: Reviewed and stable  Complications: No apparent anesthesia complications

## 2012-08-07 NOTE — Anesthesia Postprocedure Evaluation (Signed)
  Anesthesia Post-op Note  Patient: Anne Joyce  Procedure(s) Performed: Procedure(s): HYSTERECTOMY ABDOMINAL (N/A)  Patient is awake and responsive. Pain and nausea are reasonably well controlled. Vital signs are stable and clinically acceptable. Oxygen saturation is clinically acceptable. There are no apparent anesthetic complications at this time. Patient is ready for discharge.

## 2012-08-07 NOTE — Transfer of Care (Signed)
Immediate Anesthesia Transfer of Care Note  Patient: Anne Joyce  Procedure(s) Performed: Procedure(s): HYSTERECTOMY ABDOMINAL (N/A)  Patient Location: PACU  Anesthesia Type:General  Level of Consciousness: sedated  Airway & Oxygen Therapy: Patient Spontanous Breathing and Patient connected to nasal cannula oxygen  Post-op Assessment: Report given to PACU RN  Post vital signs: Reviewed  Complications: No apparent anesthesia complications

## 2012-08-08 LAB — CBC WITH DIFFERENTIAL/PLATELET
Basophils Absolute: 0 10*3/uL (ref 0.0–0.1)
Eosinophils Relative: 0 % (ref 0–5)
HCT: 32.1 % — ABNORMAL LOW (ref 36.0–46.0)
Hemoglobin: 10.3 g/dL — ABNORMAL LOW (ref 12.0–15.0)
Lymphocytes Relative: 30 % (ref 12–46)
MCHC: 32.1 g/dL (ref 30.0–36.0)
MCV: 87.9 fL (ref 78.0–100.0)
Monocytes Absolute: 0.6 10*3/uL (ref 0.1–1.0)
Monocytes Relative: 8 % (ref 3–12)
RDW: 14.1 % (ref 11.5–15.5)
WBC: 8.1 10*3/uL (ref 4.0–10.5)

## 2012-08-08 MED ORDER — OXYCODONE-ACETAMINOPHEN 5-325 MG PO TABS
1.0000 | ORAL_TABLET | ORAL | Status: DC | PRN
  Administered 2012-08-08 – 2012-08-09 (×6): 1 via ORAL
  Filled 2012-08-08: qty 2
  Filled 2012-08-08: qty 1
  Filled 2012-08-08 (×2): qty 2
  Filled 2012-08-08: qty 1
  Filled 2012-08-08: qty 2

## 2012-08-08 NOTE — Progress Notes (Signed)
Patient ID: Anne Joyce, female   DOB: [birth year], 47 y.o.   MRN: [id number] #1 Afebrile BP normal Output good HGB stable. Abdomen soft and not tender. Pt has been able to take minimal po Will d/c PCA

## 2012-08-09 MED ORDER — OXYCODONE-ACETAMINOPHEN 5-325 MG PO TABS: 1.0000 | ORAL_TABLET | Freq: Four times a day (QID) | ORAL | Status: DC | PRN

## 2012-08-09 MED ORDER — CALCIUM CARBONATE 1250 (500 CA) MG PO TABS: 1.0000 | ORAL_TABLET | Freq: Every day | ORAL | Status: DC

## 2012-08-09 MED ORDER — IBUPROFEN 600 MG PO TABS: 600.0000 mg | ORAL_TABLET | Freq: Four times a day (QID) | ORAL | Status: DC | PRN

## 2012-08-09 MED FILL — Calcium Carbonate Tab 1250 MG (500 MG Elemental Ca): ORAL | Qty: 1 | Status: AC

## 2012-08-09 NOTE — Progress Notes (Signed)
Patient ID: Anne Joyce, female   DOB: 11-25-1965, 47 y.o.   MRN: [id number] #2 afebrile Doing well She is ambulating, tolerating a diet, passing flatus and voiding well. She is ready for d/c. The path report was benign

## 2012-08-09 NOTE — Progress Notes (Signed)
Discharge instructions provided to patient at bedside.  Medications, activity, follow up appointments, when to call the doctor, incision care and community resources dicussed.  No questions at this time.  Patient left unit in stable condition with all personal belongings and prescriptions.  Osvaldo Angst, RN------------------

## 2012-08-09 NOTE — Discharge Summary (Signed)
NAMELAURELLA, TULL              ACCOUNT NO.:  [id number]  MEDICAL RECORD NO.:  [id number]  LOCATION:  9309                          FACILITY:  [facility]  PHYSICIAN:  Malachi Pro. Ambrose Mantle, M.D. DATE OF BIRTH:  March 10, 1965  DATE OF ADMISSION:  08/07/2012 DATE OF DISCHARGE:  08/09/2012                              DISCHARGE SUMMARY   This is a 47 year old black female, para 2-0-2-2, admitted because of menorrhagia and very large fibroids for abdominal hysterectomy.  The patient underwent that procedure on August 07, 2012; has done well postoperatively.  On the second postop day, is passing flatus, she thinks, voiding well, ambulating well, tolerating a diet, and is ready for discharge.  LABORATORY DATA:  Preop hemoglobin 11.0, hematocrit 34.3, white count 6200, platelet count 307,000, normal differential.  Comprehensive metabolic profile was normal except for total protein of 8.4, total bilirubin of 0.2.  Followup hemoglobins were 10.8 and 10.3.  White counts were 11,500, 8100.  Her path report showed 1078 g uterus with multiple fibroids, up to 9 cm, benign proliferative phase endometrium, benign uterine adenomyosis, benign endometrial polyp, benign cervical mucosa, benign serosa.  FINAL DIAGNOSES:  Leiomyomata uteri, adenomyosis, endometrial polyp, HIV positive.  OPERATION:  Abdominal hysterectomy.  FINAL CONDITION:  Improved.  INSTRUCTIONS:  Include our regular discharge instructions.  No vaginal entrance, no heavy lifting or strenuous activity.  Call with fever above 100.4 degrees.  Call with heavy bleeding.  Return to the office in 5 days to have her staples removed.  Percocet 5/325 and Motrin 600 mg, 30 of each 1 every 6 hours as needed for pain is given at discharge.     Malachi Pro. Ambrose Mantle, M.D.     TFH/MEDQ  D:  08/09/2012  T:  08/09/2012  Job:  [id number]

## 2012-11-06 ENCOUNTER — Telehealth: Payer: Self-pay | Admitting: *Deleted

## 2012-11-06 ENCOUNTER — Ambulatory Visit: Payer: Self-pay | Admitting: Internal Medicine

## 2012-11-06 NOTE — Telephone Encounter (Signed)
Requested pt call [facility] for new appt w/ Dr. Campbell. 

## 2012-11-13 ENCOUNTER — Telehealth: Payer: Self-pay | Admitting: *Deleted

## 2012-11-13 NOTE — Telephone Encounter (Signed)
Requested pt call [facility] to schedule appt.

## 2012-11-25 ENCOUNTER — Ambulatory Visit (INDEPENDENT_AMBULATORY_CARE_PROVIDER_SITE_OTHER): Payer: BC Managed Care – PPO | Admitting: Internal Medicine

## 2012-11-25 ENCOUNTER — Encounter: Payer: Self-pay | Admitting: Internal Medicine

## 2012-11-25 ENCOUNTER — Encounter (INDEPENDENT_AMBULATORY_CARE_PROVIDER_SITE_OTHER): Payer: Self-pay

## 2012-11-25 ENCOUNTER — Telehealth: Payer: Self-pay | Admitting: *Deleted

## 2012-11-25 VITALS — BP 154/92 | HR 77 | Temp 99.4°F | Wt 207.0 lb

## 2012-11-25 DIAGNOSIS — B20 Human immunodeficiency virus [HIV] disease: Secondary | ICD-10-CM

## 2012-11-25 DIAGNOSIS — J069 Acute upper respiratory infection, unspecified: Secondary | ICD-10-CM

## 2012-11-25 DIAGNOSIS — Z23 Encounter for immunization: Secondary | ICD-10-CM

## 2012-11-25 LAB — COMPREHENSIVE METABOLIC PANEL
BUN: 9 mg/dL (ref 6–23)
CO2: 28 mEq/L (ref 19–32)
Calcium: 9.8 mg/dL (ref 8.4–10.5)
Creat: 0.87 mg/dL (ref 0.50–1.10)
Glucose, Bld: 104 mg/dL — ABNORMAL HIGH (ref 70–99)
Total Bilirubin: 0.3 mg/dL (ref 0.3–1.2)

## 2012-11-25 LAB — CBC
HCT: 37.4 % (ref 36.0–46.0)
MCH: 28.3 pg (ref 26.0–34.0)
MCV: 84.6 fL (ref 78.0–100.0)
Platelets: 311 10*3/uL (ref 150–400)
RBC: 4.42 MIL/uL (ref 3.87–5.11)

## 2012-11-25 MED ORDER — ELVITEG-COBIC-EMTRICIT-TENOFDF 150-150-200-300 MG PO TABS: 1.0000 | ORAL_TABLET | Freq: Every day | ORAL | Status: DC

## 2012-11-25 NOTE — Telephone Encounter (Signed)
Patient called and advised she has to wait for the pharmacy to call her insurance company before they can fill the Rx she was given today. She also advised she can not afford the copay on the medication. Advised her we can give her a copay card and that can help with the cost. Also if the pharmacy needs anything they will call or fax [place] and we will take care of it. If she has any questions just call.
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
## 2012-11-26 ENCOUNTER — Telehealth: Payer: Self-pay | Admitting: *Deleted

## 2012-11-26 ENCOUNTER — Other Ambulatory Visit: Payer: Self-pay | Admitting: *Deleted

## 2012-11-26 DIAGNOSIS — B20 Human immunodeficiency virus [HIV] disease: Secondary | ICD-10-CM

## 2012-11-26 LAB — T-HELPER CELL (CD4) - (RCID CLINIC ONLY)
CD4 % Helper T Cell: 19 % — ABNORMAL LOW (ref 33–55)
CD4 T Cell Abs: 460 /uL (ref 400–2700)

## 2012-11-26 MED ORDER — ELVITEG-COBIC-EMTRICIT-TENOFDF 150-150-200-300 MG PO TABS: 1.0000 | ORAL_TABLET | Freq: Every day | ORAL | Status: DC

## 2012-11-26 NOTE — Telephone Encounter (Signed)
Called patient's insurance because patient said she was unable to get her medication, Stribild. This needs to go through a mail order company [employer]. Rx sent to this mail order and patient notified. Anne Joyce

## 2012-12-12 ENCOUNTER — Other Ambulatory Visit: Payer: Self-pay | Admitting: *Deleted

## 2012-12-12 DIAGNOSIS — B20 Human immunodeficiency virus [HIV] disease: Secondary | ICD-10-CM

## 2012-12-12 MED ORDER — ELVITEG-COBIC-EMTRICIT-TENOFDF 150-150-200-300 MG PO TABS: 1.0000 | ORAL_TABLET | Freq: Every day | ORAL | Status: DC

## 2012-12-13 ENCOUNTER — Telehealth: Payer: Self-pay | Admitting: *Deleted

## 2012-12-13 NOTE — Telephone Encounter (Signed)
Patient's Stribild was rejected at the pharmacy re: needed prior authorization.  Tamika initiated the PA via the insurance company's website per their instructions, but was unsuccessful - they sent her to a Medicare PA site but the patient has a [employer].  I called the number listed in their rejection notice and spoke to a customer service rep who stated that Stribild did NOT need a prior authorization, but just needed to be sent to a specialty pharmacy.  RN stated that we HAD sent it to a specialty pharmacy, but it was still rejected.  The rep Nadine Counts in [place]), went through the customer's specific insurance coverage package and selected a pharmacy that should have no issues filling and delivering the scripts.  Prescription resent to [facility] in [place].  The rep documented everything under a reference number [id number]. Andree Coss, RN

## 2012-12-13 NOTE — Telephone Encounter (Signed)
Thanks Michelle

## 2012-12-17 ENCOUNTER — Telehealth: Payer: Self-pay | Admitting: Licensed Clinical Social Worker

## 2012-12-17 NOTE — Telephone Encounter (Signed)
Chari Manning from [facility] specialty pharmacy called stating that the patient is unable to get her medication due to her not paying the insurance premium, she is currently working on that and stated the patient would be contacting our office if assistance is needed.

## 2013-01-06 ENCOUNTER — Other Ambulatory Visit: Payer: Self-pay

## 2013-01-21 ENCOUNTER — Ambulatory Visit: Payer: Self-pay | Admitting: Internal Medicine

## 2013-01-28 ENCOUNTER — Ambulatory Visit: Payer: Self-pay | Admitting: Internal Medicine

## 2013-01-28 ENCOUNTER — Telehealth: Payer: Self-pay | Admitting: *Deleted

## 2013-01-28 NOTE — Telephone Encounter (Signed)
Requested pt call for new appt.

## 2013-04-02 ENCOUNTER — Other Ambulatory Visit: Payer: BC Managed Care – PPO

## 2013-04-02 LAB — COMPREHENSIVE METABOLIC PANEL
ALT: 15 U/L (ref 0–35)
AST: 16 U/L (ref 0–37)
Albumin: 3.9 g/dL (ref 3.5–5.2)
Alkaline Phosphatase: 61 U/L (ref 39–117)
BILIRUBIN TOTAL: 0.5 mg/dL (ref 0.2–1.2)
BUN: 12 mg/dL (ref 6–23)
CO2: 28 meq/L (ref 19–32)
CREATININE: 0.87 mg/dL (ref 0.50–1.10)
Calcium: 9.2 mg/dL (ref 8.4–10.5)
Chloride: 101 mEq/L (ref 96–112)
GLUCOSE: 99 mg/dL (ref 70–99)
Potassium: 4.3 mEq/L (ref 3.5–5.3)
Sodium: 136 mEq/L (ref 135–145)
TOTAL PROTEIN: 7.5 g/dL (ref 6.0–8.3)

## 2013-04-02 LAB — CBC
HCT: 37.6 % (ref 36.0–46.0)
HEMOGLOBIN: 12.7 g/dL (ref 12.0–15.0)
MCH: 28.8 pg (ref 26.0–34.0)
MCHC: 33.8 g/dL (ref 30.0–36.0)
MCV: 85.3 fL (ref 78.0–100.0)
Platelets: 251 10*3/uL (ref 150–400)
RBC: 4.41 MIL/uL (ref 3.87–5.11)
RDW: 13.4 % (ref 11.5–15.5)
WBC: 4 10*3/uL (ref 4.0–10.5)

## 2013-04-03 LAB — T-HELPER CELL (CD4) - (RCID CLINIC ONLY)
CD4 T CELL ABS: 400 /uL (ref 400–2700)
CD4 T CELL HELPER: 20 % — AB (ref 33–55)

## 2013-04-04 LAB — HIV-1 RNA QUANT-NO REFLEX-BLD
HIV 1 RNA QUANT: 10350 {copies}/mL — AB (ref <20)
HIV-1 RNA QUANT, LOG: 4.01 {Log} — AB (ref <1.30)

## 2013-04-09 DIAGNOSIS — E785 Hyperlipidemia, unspecified: Secondary | ICD-10-CM | POA: Insufficient documentation

## 2013-04-10 ENCOUNTER — Encounter: Payer: Self-pay | Admitting: Internal Medicine

## 2013-04-10 ENCOUNTER — Ambulatory Visit (INDEPENDENT_AMBULATORY_CARE_PROVIDER_SITE_OTHER): Payer: BC Managed Care – PPO | Admitting: Internal Medicine

## 2013-04-10 ENCOUNTER — Telehealth: Payer: Self-pay

## 2013-04-10 VITALS — BP 132/84 | HR 62 | Temp 97.8°F | Ht 64.5 in | Wt 184.8 lb

## 2013-04-10 DIAGNOSIS — B20 Human immunodeficiency virus [HIV] disease: Secondary | ICD-10-CM

## 2013-04-10 DIAGNOSIS — E785 Hyperlipidemia, unspecified: Secondary | ICD-10-CM

## 2013-04-10 MED ORDER — ELVITEG-COBIC-EMTRICIT-TENOFDF 150-150-200-300 MG PO TABS: 1.0000 | ORAL_TABLET | Freq: Every day | ORAL | Status: DC
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
## 2013-04-10 NOTE — Telephone Encounter (Signed)
Pt is unsure where Stribild script was called today during her visit.  She would like medications to be sent to [facility] on [address] [facility] [place], RN

## 2013-05-28 ENCOUNTER — Other Ambulatory Visit: Payer: Self-pay

## 2013-06-10 ENCOUNTER — Ambulatory Visit: Payer: Self-pay | Admitting: Internal Medicine

## 2013-06-11 ENCOUNTER — Other Ambulatory Visit: Payer: BC Managed Care – PPO

## 2013-06-11 DIAGNOSIS — Z113 Encounter for screening for infections with a predominantly sexual mode of transmission: Secondary | ICD-10-CM

## 2013-06-11 DIAGNOSIS — Z79899 Other long term (current) drug therapy: Secondary | ICD-10-CM

## 2013-06-11 DIAGNOSIS — B20 Human immunodeficiency virus [HIV] disease: Secondary | ICD-10-CM

## 2013-06-11 LAB — CBC
HCT: 37.2 % (ref 36.0–46.0)
Hemoglobin: 12.4 g/dL (ref 12.0–15.0)
MCH: 29 pg (ref 26.0–34.0)
MCHC: 33.3 g/dL (ref 30.0–36.0)
MCV: 87.1 fL (ref 78.0–100.0)
PLATELETS: 273 10*3/uL (ref 150–400)
RBC: 4.27 MIL/uL (ref 3.87–5.11)
RDW: 15.3 % (ref 11.5–15.5)
WBC: 4.2 10*3/uL (ref 4.0–10.5)

## 2013-06-11 LAB — COMPREHENSIVE METABOLIC PANEL
ALT: 21 U/L (ref 0–35)
AST: 22 U/L (ref 0–37)
Albumin: 3.8 g/dL (ref 3.5–5.2)
Alkaline Phosphatase: 64 U/L (ref 39–117)
BILIRUBIN TOTAL: 0.4 mg/dL (ref 0.2–1.2)
BUN: 15 mg/dL (ref 6–23)
CALCIUM: 9.4 mg/dL (ref 8.4–10.5)
CHLORIDE: 101 meq/L (ref 96–112)
CO2: 26 mEq/L (ref 19–32)
CREATININE: 0.85 mg/dL (ref 0.50–1.10)
Glucose, Bld: 88 mg/dL (ref 70–99)
Potassium: 4.5 mEq/L (ref 3.5–5.3)
Sodium: 135 mEq/L (ref 135–145)
Total Protein: 7.4 g/dL (ref 6.0–8.3)

## 2013-06-11 LAB — LIPID PANEL
CHOL/HDL RATIO: 3.7 ratio
Cholesterol: 168 mg/dL (ref 0–200)
HDL: 46 mg/dL (ref >39)
LDL CALC: 114 mg/dL — AB (ref 0–99)
Triglycerides: 41 mg/dL (ref <150)
VLDL: 8 mg/dL (ref 0–40)

## 2013-06-12 LAB — RPR

## 2013-06-12 LAB — HIV-1 RNA QUANT-NO REFLEX-BLD
HIV 1 RNA Quant: <20 copies/mL (ref <20)
HIV-1 RNA Quant, Log: <1.3 {Log} (ref <1.30)

## 2013-06-12 LAB — T-HELPER CELL (CD4) - (RCID CLINIC ONLY)
CD4 % Helper T Cell: 24 % — ABNORMAL LOW (ref 33–55)
CD4 T CELL ABS: 550 /uL (ref 400–2700)

## 2013-06-16 ENCOUNTER — Telehealth: Payer: Self-pay | Admitting: *Deleted

## 2013-06-16 DIAGNOSIS — B20 Human immunodeficiency virus [HIV] disease: Secondary | ICD-10-CM

## 2013-06-16 MED ORDER — ELVITEG-COBIC-EMTRICIT-TENOFDF 150-150-200-300 MG PO TABS: 1.0000 | ORAL_TABLET | Freq: Every day | ORAL | Status: DC

## 2013-06-16 NOTE — Telephone Encounter (Signed)
Will fax signed prescription to [facility].  Pt last refilled Stribild at [facility] in May.  Has not filled for June due to needing to use [facility] for specialty medications.

## 2013-06-23 ENCOUNTER — Other Ambulatory Visit: Payer: Self-pay

## 2013-06-23 DIAGNOSIS — Z1231 Encounter for screening mammogram for malignant neoplasm of breast: Secondary | ICD-10-CM

## 2013-06-24 ENCOUNTER — Ambulatory Visit (INDEPENDENT_AMBULATORY_CARE_PROVIDER_SITE_OTHER): Payer: BC Managed Care – PPO | Admitting: Internal Medicine

## 2013-06-24 ENCOUNTER — Encounter: Payer: Self-pay | Admitting: Internal Medicine

## 2013-06-24 VITALS — BP 122/78 | HR 54 | Temp 98.2°F | Wt 195.2 lb

## 2013-06-24 DIAGNOSIS — B20 Human immunodeficiency virus [HIV] disease: Secondary | ICD-10-CM
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
## 2013-06-27 ENCOUNTER — Ambulatory Visit: Payer: Self-pay

## 2013-07-02 ENCOUNTER — Ambulatory Visit: Payer: Self-pay

## 2013-07-16 ENCOUNTER — Ambulatory Visit
Admission: RE | Admit: 2013-07-16 | Discharge: 2013-07-16 | Disposition: A | Discharge status: Home / Self Care | Payer: BC Managed Care – PPO | Source: Ambulatory Visit

## 2013-07-16 DIAGNOSIS — Z1231 Encounter for screening mammogram for malignant neoplasm of breast: Secondary | ICD-10-CM

## 2013-09-17 ENCOUNTER — Other Ambulatory Visit (INDEPENDENT_AMBULATORY_CARE_PROVIDER_SITE_OTHER): Payer: BC Managed Care – PPO

## 2013-09-17 DIAGNOSIS — B20 Human immunodeficiency virus [HIV] disease: Secondary | ICD-10-CM

## 2013-09-17 LAB — COMPLETE METABOLIC PANEL WITH GFR
ALT: 20 U/L (ref 0–35)
AST: 17 U/L (ref 0–37)
Albumin: 3.9 g/dL (ref 3.5–5.2)
Alkaline Phosphatase: 70 U/L (ref 39–117)
BILIRUBIN TOTAL: 0.5 mg/dL (ref 0.2–1.2)
BUN: 12 mg/dL (ref 6–23)
CALCIUM: 9.5 mg/dL (ref 8.4–10.5)
CO2: 28 mEq/L (ref 19–32)
Chloride: 101 mEq/L (ref 96–112)
Creat: 0.94 mg/dL (ref 0.50–1.10)
GFR, Est African American: 84 mL/min
GFR, Est Non African American: 72 mL/min
Glucose, Bld: 108 mg/dL — ABNORMAL HIGH (ref 70–99)
Potassium: 3.8 mEq/L (ref 3.5–5.3)
Sodium: 137 mEq/L (ref 135–145)
Total Protein: 7.1 g/dL (ref 6.0–8.3)

## 2013-09-18 LAB — T-HELPER CELL (CD4) - (RCID CLINIC ONLY)
CD4 % Helper T Cell: 25 % — ABNORMAL LOW (ref 33–55)
CD4 T CELL ABS: 510 /uL (ref 400–2700)

## 2013-09-18 LAB — HIV-1 RNA QUANT-NO REFLEX-BLD

## 2013-09-19 ENCOUNTER — Other Ambulatory Visit: Payer: Self-pay

## 2013-09-30 ENCOUNTER — Ambulatory Visit (INDEPENDENT_AMBULATORY_CARE_PROVIDER_SITE_OTHER): Payer: BC Managed Care – PPO | Admitting: Internal Medicine

## 2013-09-30 ENCOUNTER — Encounter: Payer: Self-pay | Admitting: Internal Medicine

## 2013-09-30 VITALS — BP 135/82 | HR 64 | Temp 98.3°F | Wt 190.0 lb

## 2013-09-30 DIAGNOSIS — Z23 Encounter for immunization: Secondary | ICD-10-CM

## 2013-09-30 DIAGNOSIS — B20 Human immunodeficiency virus [HIV] disease: Secondary | ICD-10-CM | POA: Diagnosis not present
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
## 2014-03-19 ENCOUNTER — Other Ambulatory Visit: Payer: BLUE CROSS/BLUE SHIELD

## 2014-03-19 DIAGNOSIS — Z113 Encounter for screening for infections with a predominantly sexual mode of transmission: Secondary | ICD-10-CM

## 2014-03-19 DIAGNOSIS — B20 Human immunodeficiency virus [HIV] disease: Secondary | ICD-10-CM

## 2014-03-19 DIAGNOSIS — Z79899 Other long term (current) drug therapy: Secondary | ICD-10-CM

## 2014-03-19 LAB — LIPID PANEL
CHOL/HDL RATIO: 3.5 ratio
Cholesterol: 179 mg/dL (ref 0–200)
HDL: 51 mg/dL (ref >=46)
LDL CALC: 115 mg/dL — AB (ref 0–99)
Triglycerides: 63 mg/dL (ref <150)
VLDL: 13 mg/dL (ref 0–40)

## 2014-03-19 LAB — CBC
HCT: 37.7 % (ref 36.0–46.0)
Hemoglobin: 12.8 g/dL (ref 12.0–15.0)
MCH: 30.8 pg (ref 26.0–34.0)
MCHC: 34 g/dL (ref 30.0–36.0)
MCV: 90.6 fL (ref 78.0–100.0)
MPV: 9 fL (ref 8.6–12.4)
PLATELETS: 267 10*3/uL (ref 150–400)
RBC: 4.16 MIL/uL (ref 3.87–5.11)
RDW: 13.2 % (ref 11.5–15.5)
WBC: 4.1 10*3/uL (ref 4.0–10.5)

## 2014-03-19 LAB — COMPREHENSIVE METABOLIC PANEL
ALBUMIN: 4.2 g/dL (ref 3.5–5.2)
ALK PHOS: 59 U/L (ref 39–117)
ALT: 16 U/L (ref 0–35)
AST: 19 U/L (ref 0–37)
BILIRUBIN TOTAL: 0.6 mg/dL (ref 0.2–1.2)
BUN: 13 mg/dL (ref 6–23)
CHLORIDE: 102 meq/L (ref 96–112)
CO2: 25 mEq/L (ref 19–32)
Calcium: 9.5 mg/dL (ref 8.4–10.5)
Creat: 1.14 mg/dL — ABNORMAL HIGH (ref 0.50–1.10)
Glucose, Bld: 83 mg/dL (ref 70–99)
POTASSIUM: 4.3 meq/L (ref 3.5–5.3)
Sodium: 139 mEq/L (ref 135–145)
Total Protein: 7.4 g/dL (ref 6.0–8.3)

## 2014-03-19 LAB — RPR

## 2014-03-20 LAB — T-HELPER CELL (CD4) - (RCID CLINIC ONLY)
CD4 % Helper T Cell: 26 % — ABNORMAL LOW (ref 33–55)
CD4 T CELL ABS: 460 /uL (ref 400–2700)

## 2014-03-22 LAB — HIV-1 RNA QUANT-NO REFLEX-BLD

## 2014-03-31 ENCOUNTER — Encounter: Payer: Self-pay | Admitting: Internal Medicine

## 2014-03-31 ENCOUNTER — Ambulatory Visit (INDEPENDENT_AMBULATORY_CARE_PROVIDER_SITE_OTHER): Payer: Self-pay | Admitting: Internal Medicine

## 2014-03-31 DIAGNOSIS — B20 Human immunodeficiency virus [HIV] disease: Secondary | ICD-10-CM

## 2014-03-31 MED ORDER — ELVITEG-COBIC-EMTRICIT-TENOFAF 150-150-200-10 MG PO TABS: 1.0000 | ORAL_TABLET | Freq: Every day | ORAL | Status: DC
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
## 2014-04-30 ENCOUNTER — Emergency Department (HOSPITAL_COMMUNITY): Payer: BLUE CROSS/BLUE SHIELD

## 2014-04-30 ENCOUNTER — Emergency Department (HOSPITAL_COMMUNITY)
Admission: EM | Admit: 2014-04-30 | Discharge: 2014-04-30 | Disposition: A | Discharge status: Home / Self Care | Payer: BLUE CROSS/BLUE SHIELD | Attending: Emergency Medicine | Admitting: Emergency Medicine

## 2014-04-30 ENCOUNTER — Encounter (HOSPITAL_COMMUNITY): Payer: Self-pay | Admitting: Emergency Medicine

## 2014-04-30 DIAGNOSIS — J069 Acute upper respiratory infection, unspecified: Secondary | ICD-10-CM | POA: Diagnosis not present

## 2014-04-30 DIAGNOSIS — Z862 Personal history of diseases of the blood and blood-forming organs and certain disorders involving the immune mechanism: Secondary | ICD-10-CM | POA: Insufficient documentation

## 2014-04-30 DIAGNOSIS — Z8781 Personal history of (healed) traumatic fracture: Secondary | ICD-10-CM | POA: Insufficient documentation

## 2014-04-30 DIAGNOSIS — Z21 Asymptomatic human immunodeficiency virus [HIV] infection status: Secondary | ICD-10-CM | POA: Insufficient documentation

## 2014-04-30 DIAGNOSIS — Z79899 Other long term (current) drug therapy: Secondary | ICD-10-CM | POA: Insufficient documentation

## 2014-04-30 DIAGNOSIS — J029 Acute pharyngitis, unspecified: Secondary | ICD-10-CM | POA: Diagnosis present

## 2014-04-30 LAB — RAPID STREP SCREEN (MED CTR MEBANE ONLY): Streptococcus, Group A Screen (Direct): NEGATIVE

## 2014-04-30 MED ORDER — HYDROCODONE-HOMATROPINE 5-1.5 MG/5ML PO SYRP: 5.0000 mL | ORAL_SOLUTION | Freq: Four times a day (QID) | ORAL | Status: DC | PRN

## 2014-04-30 MED ORDER — ACETAMINOPHEN 500 MG PO TABS
1000.0000 mg | ORAL_TABLET | Freq: Once | ORAL | Status: AC
  Administered 2014-04-30: 1000 mg via ORAL
  Filled 2014-04-30: qty 2

## 2014-04-30 NOTE — ED Notes (Signed)
Pt c/o sore throat, fever and cough x 3 days.
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
## 2014-04-30 NOTE — ED Notes (Signed)
Coricidin at home, last dose at 2000, using throat lozenges, and using hot heat, and taking ibuprofen, last dose at 2000.

## 2014-04-30 NOTE — Discharge Instructions (Signed)
Upper Respiratory Infection, Adult Take tylenol or motrin for fever. Follow up with your physician within 48 hours.  Return for worsening symptoms such as shortness of breath, enlarged lymph nodes or bloody sputum.  An upper respiratory infection (URI) is also sometimes known as the common cold. The upper respiratory tract includes the nose, sinuses, throat, trachea, and bronchi. Bronchi are the airways leading to the lungs. Most people improve within 1 week, but symptoms can last up to 2 weeks. A residual cough may last even longer.  CAUSES Many different viruses can infect the tissues lining the upper respiratory tract. The tissues become irritated and inflamed and often become very moist. Mucus production is also common. A cold is contagious. You can easily spread the virus to others by oral contact. This includes kissing, sharing a glass, coughing, or sneezing. Touching your mouth or nose and then touching a surface, which is then touched by another person, can also spread the virus. SYMPTOMS  Symptoms typically develop 1 to 3 days after you come in contact with a cold virus. Symptoms vary from person to person. They may include:  Runny nose.  Sneezing.  Nasal congestion.  Sinus irritation.  Sore throat.  Loss of voice (laryngitis).  Cough.  Fatigue.  Muscle aches.  Loss of appetite.  Headache.  Low-grade fever. DIAGNOSIS  You might diagnose your own cold based on familiar symptoms, since most people get a cold 2 to 3 times a year. Your caregiver can confirm this based on your exam. Most importantly, your caregiver can check that your symptoms are not due to another disease such as strep throat, sinusitis, pneumonia, asthma, or epiglottitis. Blood tests, throat tests, and X-rays are not necessary to diagnose a common cold, but they may sometimes be helpful in excluding other more serious diseases. Your caregiver will decide if any further tests are required. RISKS AND  COMPLICATIONS  You may be at risk for a more severe case of the common cold if you smoke cigarettes, have chronic heart disease (such as heart failure) or lung disease (such as asthma), or if you have a weakened immune system. The very young and very old are also at risk for more serious infections. Bacterial sinusitis, middle ear infections, and bacterial pneumonia can complicate the common cold. The common cold can worsen asthma and chronic obstructive pulmonary disease (COPD). Sometimes, these complications can require emergency medical care and may be life-threatening. PREVENTION  The best way to protect against getting a cold is to practice good hygiene. Avoid oral or hand contact with people with cold symptoms. Wash your hands often if contact occurs. There is no clear evidence that vitamin C, vitamin E, echinacea, or exercise reduces the chance of developing a cold. However, it is always recommended to get plenty of rest and practice good nutrition. TREATMENT  Treatment is directed at relieving symptoms. There is no cure. Antibiotics are not effective, because the infection is caused by a virus, not by bacteria. Treatment may include:  Increased fluid intake. Sports drinks offer valuable electrolytes, sugars, and fluids.  Breathing heated mist or steam (vaporizer or shower).  Eating chicken soup or other clear broths, and maintaining good nutrition.  Getting plenty of rest.  Using gargles or lozenges for comfort.  Controlling fevers with ibuprofen or acetaminophen as directed by your caregiver.  Increasing usage of your inhaler if you have asthma. Zinc gel and zinc lozenges, taken in the first 24 hours of the common cold, can shorten the duration and  lessen the severity of symptoms. Pain medicines may help with fever, muscle aches, and throat pain. A variety of non-prescription medicines are available to treat congestion and runny nose. Your caregiver can make recommendations and may  suggest nasal or lung inhalers for other symptoms.  HOME CARE INSTRUCTIONS   Only take over-the-counter or prescription medicines for pain, discomfort, or fever as directed by your caregiver.  Use a warm mist humidifier or inhale steam from a shower to increase air moisture. This may keep secretions moist and make it easier to breathe.  Drink enough water and fluids to keep your urine clear or pale yellow.  Rest as needed.  Return to work when your temperature has returned to normal or as your caregiver advises. You may need to stay home longer to avoid infecting others. You can also use a face mask and careful hand washing to prevent spread of the virus. SEEK MEDICAL CARE IF:   After the first few days, you feel you are getting worse rather than better.  You need your caregiver's advice about medicines to control symptoms.  You develop chills, worsening shortness of breath, or brown or red sputum. These may be signs of pneumonia.  You develop yellow or brown nasal discharge or pain in the face, especially when you bend forward. These may be signs of sinusitis.  You develop a fever, swollen neck glands, pain with swallowing, or white areas in the back of your throat. These may be signs of strep throat. SEEK IMMEDIATE MEDICAL CARE IF:   You have a fever.  You develop severe or persistent headache, ear pain, sinus pain, or chest pain.  You develop wheezing, a prolonged cough, cough up blood, or have a change in your usual mucus (if you have chronic lung disease).  You develop sore muscles or a stiff neck. Document Released: 06/21/2000 Document Revised: 03/20/2011 Document Reviewed: 04/02/2013 [facility]: Lambert Rhonda W Patient Information 2015 [place], [place]. This information is not intended to replace advice given to you by your health care provider. Make sure you discuss any questions you have with your health care provider.  Emergency Department Resource Guide 1) Find a Doctor and Pay Out of  Pocket Although you won't have to find out who is covered by your insurance plan, it is a good idea to ask around and get recommendations. You will then need to call the office and see if the doctor you have chosen will accept you as a new patient and what types of options they offer for patients who are self-pay. Some doctors offer discounts or will set up payment plans for their patients who do not have insurance, but you will need to ask so you aren't surprised when you get to your appointment.  2) Contact Your Local Health Department Not all health departments have doctors that can see patients for sick visits, but many do, so it is worth a call to see if yours does. If you don't know where your local health department is, you can check in your phone book. The CDC also has a tool to help you locate your state's health department, and many state websites also have listings of all of their local health departments.  3) Find a [facility] If your illness is not likely to be very severe or complicated, you may want to try a walk in clinic. These are popping up all over the country in pharmacies, drugstores, and shopping centers. They're usually staffed by nurse practitioners or physician assistants that have been  trained to treat common illnesses and complaints. They're usually fairly quick and inexpensive. However, if you have serious medical issues or chronic medical problems, these are probably not your best option.  No Primary Care Doctor: - Call Health Connect at  [phone] - they can help you locate a primary care doctor that  accepts your insurance, provides certain services, etc. - Physician Referral Service- [phone]  Chronic Pain Problems: Organization         Address  Phone   Notes  [facility]  [phone] Patients need to be referred by their primary care doctor.   Medication Assistance: Organization         Address  Phone   Notes  [place]  Medication [facility] [address] [place], [place] [address] [phone] --Must be a resident of [facility] -- Must have NO insurance coverage whatsoever (no Medicaid/ Medicare, etc.) -- The pt. MUST have a primary care doctor that directs their care regularly and follows them in the community   MedAssist  [phone]   [employer]  [phone]    Agencies that provide inexpensive medical care: Organization         Address  Phone   Notes  [place]  [phone]   [facility]    [phone]   [facility] [place],  [phone]   [place] [address] [place] [phone]   Planned Parenthood    [phone]   [facility]    [phone]   [place] and [place] [address], [place] Phone:  [phone], Fax:  [phone] Hours of Operation:  9 am - 6 pm, M-F.  Also accepts Medicaid/Medicare and self-pay.  [facility] for [place], [address], [place] Phone: [phone], Fax: [phone]. Hours of Operation:  8:30 am - 5:30 pm, M-F.  Also accepts Medicaid and self-pay.  [facility] [place] [address], [place] Phone: [phone]   [place], [place], [place] [phone], Ext. [phone] Mondays & Thursdays: 7-9 AM.  First 15 patients are seen on a first come, first serve basis.    [place] Providers:  Organization         Address  Phone   Notes  [facility] [address], [place] [phone] Also accepts self-pay patients.  [facility] [address], [place]  [phone]   [place], [address], [place] [phone]   [facility] [address], [place] [phone]   Lucianne Lei [address], [place]   [phone] Only accepts [place] Access [place] patients after they have their name applied to their card.   Self-Pay (no insurance) in [facility]:  Organization         Address  Phone   Notes  Sickle Cell Patients, [facility] Internal Medicine [place] [phone]   [facility] Urgent Care [place] [phone]   [facility]arias Pontes Urgent [place]  [place], [address], [place] [phone]   [facility]/Dr. Osei-Bonsu  [address] [place] or [place]  [address], [place] [phone] Phone number for both [facility] and [facility] locations is the same.  Urgent Medical and [facility] [address], [place] [phone]   [facility] [address], [place] or [address] [phone]   [facility] [address], [place] [phone], phone; [phone], fax Sees patients 1st and 3rd Saturday of every month.  Must not qualify for public or private insurance (i.e. Medicaid, Medicare, [place] Health Choice, Veterans' Benefits)  Household income should be no more than 200% of the poverty level The clinic cannot treat you if you are pregnant or think you are pregnant  Sexually transmitted diseases are not treated at the clinic.    Dental Care: Organization         Address  Phone  Notes  [facility] Department of [facility] [phone] Accepts children up to age 34 who are enrolled in [place] or [place]; pregnant women with a Medicaid card; and children who have applied for Medicaid or [place] Health Choice, but were declined, whose parents can pay a reduced fee at time of service.  [facility] Department of [facility]  [address], [place]  [phone] Accepts children up to age 89 who are enrolled in [place] or [place]; pregnant women with a Medicaid card; and children who have applied for Medicaid or [place] Health Choice, but were declined, whose parents can pay a reduced fee at time of service.  [place] Adult Dental Access PROGRAM  [place] [phone] Patients are seen by appointment only. Walk-ins are not accepted. [facility] will see patients 29 years of age and older. Monday - Tuesday (8am-5pm) Most Wednesdays (8:30-5pm) $30 per visit, cash only  [facility] Adult Dental Access PROGRAM  [address], [facility] [phone] Patients are seen by appointment only. Walk-ins are not accepted. [place] will see patients 9 years of age and older. One Wednesday Evening (Monthly: Volunteer Based).  $30 per visit, cash only  [place]  [phone] for adults; Children under age 22, call Graduate Pediatric Dentistry at [phone]. Children aged 55-14, please call [phone] to request a pediatric application.  Dental services are provided in all areas of dental care including fillings, crowns and bridges, complete and partial dentures, implants, gum treatment, root canals, and extractions. Preventive care is also provided. Treatment is provided to both adults and children. Patients are selected via a lottery and there is often a waiting list.   [facility] [address] [place]  [phone] [email or web address]   [facility] [address] [place], [place] [phone], Ext. [phone] Second and Fourth Thursday of each month, opens at 6:30 AM; Clinic ends at 9 AM.  Patients are seen on a first-come first-served basis, and a limited number are seen during each clinic.   [facility]  [address] Hillard Danker [place], [place] [phone]   Eligibility Requirements You must have lived in [place], [place], or [place]  counties for at least the last three months.   You cannot be eligible for state or federal sponsored [employer], including [employer], [place], or [employer].   You generally cannot be eligible for healthcare insurance through your employer.    How to apply: Eligibility screenings are  held every Tuesday and Wednesday afternoon from 1:00 pm until 4:00 pm. You do not need an appointment for the interview!  [facility] [address] [place], [place]   [place]  [facility] Department  [place]  [phone]    Behavioral Health Resources in the Community: Intensive Outpatient Programs Organization         Address  Phone  Notes  [place]. [address] [place], [place] [phone]   [facility] Outpatient [address], [place], [place]   ADS: Alcohol & Drug Svcs [address], [place], [place]   [place] [address]  [place], [place] or [phone]   Substance Abuse Resources Organization         Address  Phone  Notes  Alcohol and Drug Services  [phone]   [place]  [phone]   [facility] [place]   Chinita Pester  [phone]   Residential & Outpatient Substance Abuse Program  [phone]   Psychological Services Organization         Address  Phone  Notes  [facility] [place]  [place]  [phone]   [place] [address], [place] or [phone]    Mobile Crisis Teams Organization         Address  Phone  Notes  [facility], Mobile Crisis Care Unit  [phone]   Assertive Psychotherapeutic Services  [address] [place], [place]   [place] Levels [address], [place] [phone]    Self-Help/Support Groups Organization         Address  Phone             Notes  [place]. of [place] - variety of support groups  [place] Call for more information  Narcotics Anonymous (NA), [facility]aring [facility] [address], [employer] [place]  2 meetings at this location   Special educational needs teacher         Address  Phone  Notes  ASAP Residential Treatment [place],    [place]  [phone]   [facility]  [address] [place] [address], [place], [place]   [place], [place] [phone] Admissions: 8am-3pm M-F  Incentives Substance [place] [address],    [place], [place] [phone]   [facility] [address] [place], [place], [place]   [facility] [address]  [place], [place]   [facility] Programs - Intensive Outpatient [address], [place], [place]   [facility] ([place].,  [place], [place] [phone] or [phone]   [facility]idential Treatment Services (RTS) [address] [place], [place] Accepts Medicaid  [facility] [place] [address],  [place] [phone] Substance Abuse/Addiction Treatment   [facility] Organization         Address  Phone  Notes  [facility]  [phone]   Domenic Schwab, PhD [address] Arlis [place], [place]   [phone] or [phone]   [place]   [address] [place], [place] [phone]   [place] [address] [place], [place] [phone] Insurance/Medicaid/sponsorship through  [facility]  Faith and Families [address] [place], [place] [phone] [place], [place] [phone]    Dr. Adele Schilder  [phone]   [facility] Dept. 1) [address] [place] 2) [place] 3)  [address], [place] [phone]  [phone]   [place] [phone] or [phone] (After Hours)

## 2014-05-02 LAB — CULTURE, GROUP A STREP: Strep A Culture: NEGATIVE

## 2015-02-11 ENCOUNTER — Other Ambulatory Visit: Payer: Self-pay

## 2015-02-11 DIAGNOSIS — B20 Human immunodeficiency virus [HIV] disease: Secondary | ICD-10-CM

## 2015-02-11 LAB — CBC WITH DIFFERENTIAL/PLATELET
BASOS PCT: 0 % (ref 0–1)
Basophils Absolute: 0 10*3/uL (ref 0.0–0.1)
Eosinophils Absolute: 0 10*3/uL (ref 0.0–0.7)
Eosinophils Relative: 1 % (ref 0–5)
HCT: 35 % — ABNORMAL LOW (ref 36.0–46.0)
HEMOGLOBIN: 11.5 g/dL — AB (ref 12.0–15.0)
LYMPHS ABS: 2.2 10*3/uL (ref 0.7–4.0)
Lymphocytes Relative: 51 % — ABNORMAL HIGH (ref 12–46)
MCH: 29.9 pg (ref 26.0–34.0)
MCHC: 32.9 g/dL (ref 30.0–36.0)
MCV: 90.9 fL (ref 78.0–100.0)
MONOS PCT: 6 % (ref 3–12)
MPV: 9.4 fL (ref 8.6–12.4)
Monocytes Absolute: 0.3 10*3/uL (ref 0.1–1.0)
NEUTROS ABS: 1.8 10*3/uL (ref 1.7–7.7)
NEUTROS PCT: 42 % — AB (ref 43–77)
Platelets: 223 10*3/uL (ref 150–400)
RBC: 3.85 MIL/uL — ABNORMAL LOW (ref 3.87–5.11)
RDW: 14.1 % (ref 11.5–15.5)
WBC: 4.4 10*3/uL (ref 4.0–10.5)

## 2015-02-11 LAB — COMPLETE METABOLIC PANEL WITH GFR
ALT: 14 U/L (ref 6–29)
AST: 16 U/L (ref 10–35)
Albumin: 3.7 g/dL (ref 3.6–5.1)
Alkaline Phosphatase: 67 U/L (ref 33–115)
BILIRUBIN TOTAL: 0.3 mg/dL (ref 0.2–1.2)
BUN: 11 mg/dL (ref 7–25)
CO2: 29 mmol/L (ref 20–31)
CREATININE: 0.78 mg/dL (ref 0.50–1.10)
Calcium: 8.7 mg/dL (ref 8.6–10.2)
Chloride: 106 mmol/L (ref 98–110)
GFR, Est African American: >89 mL/min (ref >=60)
GLUCOSE: 79 mg/dL (ref 65–99)
Potassium: 3.9 mmol/L (ref 3.5–5.3)
SODIUM: 143 mmol/L (ref 135–146)
TOTAL PROTEIN: 6.7 g/dL (ref 6.1–8.1)

## 2015-02-12 LAB — T-HELPER CELL (CD4) - (RCID CLINIC ONLY)
CD4 % Helper T Cell: 23 % — ABNORMAL LOW (ref 33–55)
CD4 T Cell Abs: 540 /uL (ref 400–2700)

## 2015-02-15 LAB — HIV-1 RNA QUANT-NO REFLEX-BLD
HIV 1 RNA Quant: 8954 copies/mL — ABNORMAL HIGH (ref <20)
HIV-1 RNA QUANT, LOG: 3.95 {Log_copies}/mL — AB (ref <1.30)

## 2015-02-25 ENCOUNTER — Encounter: Payer: Self-pay | Admitting: Internal Medicine

## 2015-02-25 ENCOUNTER — Ambulatory Visit (INDEPENDENT_AMBULATORY_CARE_PROVIDER_SITE_OTHER): Payer: Self-pay | Admitting: Internal Medicine

## 2015-02-25 VITALS — BP 159/100 | HR 52 | Temp 98.2°F | Wt 193.5 lb

## 2015-02-25 DIAGNOSIS — E669 Obesity, unspecified: Secondary | ICD-10-CM

## 2015-02-25 DIAGNOSIS — B20 Human immunodeficiency virus [HIV] disease: Secondary | ICD-10-CM

## 2015-02-25 DIAGNOSIS — F4310 Post-traumatic stress disorder, unspecified: Secondary | ICD-10-CM

## 2015-02-25 DIAGNOSIS — J069 Acute upper respiratory infection, unspecified: Secondary | ICD-10-CM

## 2015-02-25 DIAGNOSIS — Z23 Encounter for immunization: Secondary | ICD-10-CM

## 2015-02-25 MED ORDER — ELVITEG-COBIC-EMTRICIT-TENOFAF 150-150-200-10 MG PO TABS: 1.0000 | ORAL_TABLET | Freq: Every day | ORAL | Status: DC

## 2015-02-25 NOTE — Assessment & Plan Note (Signed)
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 She has already started the process of ADAP certification and I will try to get her back on Genvoya as soon as possible. She will follow-up in 6 weeks.
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
## 2015-03-10 ENCOUNTER — Other Ambulatory Visit: Payer: Self-pay | Admitting: *Deleted

## 2015-03-10 DIAGNOSIS — B20 Human immunodeficiency virus [HIV] disease: Secondary | ICD-10-CM

## 2015-03-10 MED ORDER — ELVITEG-COBIC-EMTRICIT-TENOFAF 150-150-200-10 MG PO TABS: 1.0000 | ORAL_TABLET | Freq: Every day | ORAL | Status: DC

## 2015-03-17 ENCOUNTER — Telehealth: Payer: Self-pay | Admitting: *Deleted

## 2015-03-17 NOTE — Telephone Encounter (Signed)
Needing Food Stamp letter revised.  Letter needs to read " currently not able to work" as well as "out of work."  Pt will pick up when ready.  She also requested PCP information.  She is in the process of obtaining Medicaid.

## 2015-03-19 NOTE — Telephone Encounter (Signed)
Patient called to check on status of her revised letter for food stamps. Advised her not done yet but that I would remind the doctor and Langley Gauss and someone will call once it is done. Patient advised she needs the letter as soon as possible.

## 2015-03-29 NOTE — Telephone Encounter (Signed)
Pt called again about revised letter.  MD please assist.

## 2015-03-30 ENCOUNTER — Encounter: Payer: Self-pay | Admitting: *Deleted

## 2015-03-30 NOTE — Telephone Encounter (Signed)
Called the patient to advise her that the letter is done and waiting on her at the front desk.

## 2015-04-10 ENCOUNTER — Encounter: Payer: Self-pay | Admitting: Internal Medicine

## 2015-04-13 ENCOUNTER — Ambulatory Visit: Payer: Medicaid Other | Admitting: *Deleted

## 2015-04-13 ENCOUNTER — Ambulatory Visit (INDEPENDENT_AMBULATORY_CARE_PROVIDER_SITE_OTHER): Payer: Medicaid Other | Admitting: Internal Medicine

## 2015-04-13 ENCOUNTER — Encounter: Payer: Self-pay | Admitting: Internal Medicine

## 2015-04-13 DIAGNOSIS — B20 Human immunodeficiency virus [HIV] disease: Secondary | ICD-10-CM | POA: Diagnosis not present

## 2015-04-13 DIAGNOSIS — F4310 Post-traumatic stress disorder, unspecified: Secondary | ICD-10-CM

## 2015-04-13 LAB — CBC
HEMATOCRIT: 38.4 % (ref 35.0–45.0)
Hemoglobin: 12.6 g/dL (ref 11.7–15.5)
MCH: 29.9 pg (ref 27.0–33.0)
MCHC: 32.8 g/dL (ref 32.0–36.0)
MCV: 91 fL (ref 80.0–100.0)
MPV: 9.4 fL (ref 7.5–12.5)
Platelets: 233 10*3/uL (ref 140–400)
RBC: 4.22 MIL/uL (ref 3.80–5.10)
RDW: 14.1 % (ref 11.0–15.0)
WBC: 4.9 10*3/uL (ref 3.8–10.8)
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
## 2015-04-13 NOTE — Assessment & Plan Note (Signed)
Her adherence appears to be excellent and I'm hopeful that her viral load will return to undetectable. I will check her lab work today. She will continue Genvoya and follow-up with me in 2 months.
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
## 2015-04-14 LAB — COMPREHENSIVE METABOLIC PANEL
ALBUMIN: 4.2 g/dL (ref 3.6–5.1)
ALK PHOS: 47 U/L (ref 33–115)
ALT: 15 U/L (ref 6–29)
AST: 17 U/L (ref 10–35)
BUN: 10 mg/dL (ref 7–25)
CO2: 25 mmol/L (ref 20–31)
Calcium: 9.5 mg/dL (ref 8.6–10.2)
Chloride: 99 mmol/L (ref 98–110)
Creat: 0.86 mg/dL (ref 0.50–1.10)
GLUCOSE: 82 mg/dL (ref 65–99)
POTASSIUM: 3.9 mmol/L (ref 3.5–5.3)
Sodium: 136 mmol/L (ref 135–146)
Total Bilirubin: 0.7 mg/dL (ref 0.2–1.2)
Total Protein: 7.4 g/dL (ref 6.1–8.1)

## 2015-04-14 LAB — HIV-1 RNA QUANT-NO REFLEX-BLD
HIV 1 RNA Quant: <20 copies/mL (ref <20)
HIV-1 RNA Quant, Log: <1.3 Log copies/mL (ref <1.30)

## 2015-04-14 LAB — T-HELPER CELL (CD4) - (RCID CLINIC ONLY)
CD4 T CELL ABS: 770 /uL (ref 400–2700)
CD4 T CELL HELPER: 28 % — AB (ref 33–55)

## 2015-05-04 ENCOUNTER — Encounter: Payer: Self-pay | Admitting: Family Medicine

## 2015-05-04 ENCOUNTER — Ambulatory Visit (INDEPENDENT_AMBULATORY_CARE_PROVIDER_SITE_OTHER): Payer: Medicaid Other | Admitting: Family Medicine

## 2015-05-04 VITALS — Ht 65.0 in | Wt 193.0 lb

## 2015-05-04 DIAGNOSIS — S82201S Unspecified fracture of shaft of right tibia, sequela: Secondary | ICD-10-CM

## 2015-05-04 MED ORDER — GABAPENTIN 300 MG PO CAPS: 300.0000 mg | ORAL_CAPSULE | Freq: Every day | ORAL | Status: DC

## 2015-05-04 MED ORDER — TRAMADOL HCL 50 MG PO TABS: 50.0000 mg | ORAL_TABLET | Freq: Four times a day (QID) | ORAL | Status: DC | PRN

## 2015-05-04 NOTE — Patient Instructions (Signed)
[facility] Dr. Ninfa Linden Wednesday May 10th at [place], [place], [place] [address] Phone: [phone]

## 2015-05-05 ENCOUNTER — Encounter: Payer: Self-pay | Admitting: Family Medicine
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
## 2015-05-18 ENCOUNTER — Encounter: Payer: Self-pay | Admitting: Internal Medicine

## 2015-06-15 ENCOUNTER — Encounter: Payer: Self-pay | Admitting: Internal Medicine

## 2015-06-15 ENCOUNTER — Encounter: Payer: Self-pay | Admitting: Family Medicine

## 2015-06-15 ENCOUNTER — Ambulatory Visit (INDEPENDENT_AMBULATORY_CARE_PROVIDER_SITE_OTHER): Payer: Medicaid Other | Admitting: Family Medicine

## 2015-06-15 ENCOUNTER — Ambulatory Visit (INDEPENDENT_AMBULATORY_CARE_PROVIDER_SITE_OTHER): Payer: Medicaid Other | Admitting: Internal Medicine

## 2015-06-15 VITALS — BP 139/89 | HR 63 | Ht 65.0 in | Wt 198.0 lb

## 2015-06-15 DIAGNOSIS — S82201S Unspecified fracture of shaft of right tibia, sequela: Secondary | ICD-10-CM

## 2015-06-15 DIAGNOSIS — B20 Human immunodeficiency virus [HIV] disease: Secondary | ICD-10-CM | POA: Diagnosis present

## 2015-06-15 NOTE — Assessment & Plan Note (Signed)
Her infection has come back under excellent control after restarting Genvoya recently. She will follow-up after blood work in 3 months.
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
## 2015-06-16 ENCOUNTER — Encounter: Payer: Self-pay | Admitting: Family Medicine
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
## 2015-07-06 ENCOUNTER — Encounter: Payer: Medicaid Other | Admitting: Family Medicine

## 2015-09-02 ENCOUNTER — Other Ambulatory Visit: Payer: Self-pay

## 2015-09-07 ENCOUNTER — Other Ambulatory Visit: Payer: Self-pay | Admitting: Internal Medicine

## 2015-09-07 DIAGNOSIS — Z1231 Encounter for screening mammogram for malignant neoplasm of breast: Secondary | ICD-10-CM

## 2015-09-16 ENCOUNTER — Ambulatory Visit: Payer: Self-pay | Admitting: Internal Medicine

## 2015-09-17 ENCOUNTER — Other Ambulatory Visit: Payer: Medicaid Other

## 2015-09-17 DIAGNOSIS — B20 Human immunodeficiency virus [HIV] disease: Secondary | ICD-10-CM

## 2015-09-17 LAB — CBC
HCT: 37.8 % (ref 35.0–45.0)
Hemoglobin: 12.6 g/dL (ref 11.7–15.5)
MCH: 30 pg (ref 27.0–33.0)
MCHC: 33.3 g/dL (ref 32.0–36.0)
MCV: 90 fL (ref 80.0–100.0)
MPV: 9.2 fL (ref 7.5–12.5)
PLATELETS: 254 10*3/uL (ref 140–400)
RBC: 4.2 MIL/uL (ref 3.80–5.10)
RDW: 13 % (ref 11.0–15.0)
WBC: 4.1 10*3/uL (ref 3.8–10.8)

## 2015-09-21 ENCOUNTER — Ambulatory Visit
Admission: RE | Admit: 2015-09-21 | Discharge: 2015-09-21 | Disposition: A | Discharge status: Home / Self Care | Payer: Medicaid Other | Source: Ambulatory Visit | Attending: Internal Medicine | Admitting: Internal Medicine

## 2015-09-21 DIAGNOSIS — Z1231 Encounter for screening mammogram for malignant neoplasm of breast: Secondary | ICD-10-CM

## 2015-09-21 LAB — HIV-1 RNA QUANT-NO REFLEX-BLD

## 2015-09-30 ENCOUNTER — Ambulatory Visit (INDEPENDENT_AMBULATORY_CARE_PROVIDER_SITE_OTHER): Payer: Medicaid Other | Admitting: Internal Medicine

## 2015-09-30 DIAGNOSIS — F4310 Post-traumatic stress disorder, unspecified: Secondary | ICD-10-CM | POA: Diagnosis not present

## 2015-09-30 DIAGNOSIS — R05 Cough: Secondary | ICD-10-CM

## 2015-09-30 DIAGNOSIS — B20 Human immunodeficiency virus [HIV] disease: Secondary | ICD-10-CM | POA: Diagnosis present

## 2015-09-30 DIAGNOSIS — Z23 Encounter for immunization: Secondary | ICD-10-CM | POA: Diagnosis not present

## 2015-09-30 DIAGNOSIS — R059 Cough, unspecified: Secondary | ICD-10-CM
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
## 2015-09-30 NOTE — Assessment & Plan Note (Signed)
Her HIV and infection has come back under excellent control since restarting her medication earlier this year. Her adherence is excellent. She will continue Genvoya and follow-up after lab work in 6 months. She received her influenza vaccine today.

## 2015-09-30 NOTE — Assessment & Plan Note (Signed)
She probably has a mild, acute viral upper respiratory infection causing her cough. She will continue over-the-counter symptomatic therapy.

## 2015-10-29 ENCOUNTER — Other Ambulatory Visit: Payer: Self-pay | Admitting: Pharmacist

## 2015-10-29 ENCOUNTER — Telehealth: Payer: Self-pay | Admitting: Pharmacist

## 2015-10-29 DIAGNOSIS — B20 Human immunodeficiency virus [HIV] disease: Secondary | ICD-10-CM

## 2015-10-29 MED ORDER — ELVITEG-COBIC-EMTRICIT-TENOFAF 150-150-200-10 MG PO TABS: 1.0000 | ORAL_TABLET | Freq: Every day | ORAL | 5 refills | Status: DC

## 2015-10-29 NOTE — Telephone Encounter (Signed)
Received a call from [facility] about [employer] [facility]. She now has Medicaid and cannot get [place] through [employer].  Called patient and sent in Rx to patient's pharmacy of choice. She will call if she has any issues.

## 2015-11-15 DIAGNOSIS — F332 Major depressive disorder, recurrent severe without psychotic features: Secondary | ICD-10-CM | POA: Diagnosis not present

## 2015-11-30 DIAGNOSIS — H5213 Myopia, bilateral: Secondary | ICD-10-CM | POA: Diagnosis not present

## 2015-11-30 DIAGNOSIS — H524 Presbyopia: Secondary | ICD-10-CM | POA: Diagnosis not present

## 2015-11-30 DIAGNOSIS — H52223 Regular astigmatism, bilateral: Secondary | ICD-10-CM | POA: Diagnosis not present

## 2015-12-01 ENCOUNTER — Ambulatory Visit (INDEPENDENT_AMBULATORY_CARE_PROVIDER_SITE_OTHER): Payer: PPO | Admitting: Physician Assistant

## 2015-12-01 DIAGNOSIS — G5601 Carpal tunnel syndrome, right upper limb: Secondary | ICD-10-CM

## 2015-12-01 DIAGNOSIS — G5602 Carpal tunnel syndrome, left upper limb: Secondary | ICD-10-CM

## 2015-12-01 MED ORDER — TRAMADOL HCL 50 MG PO TABS: 50.0000 mg | ORAL_TABLET | Freq: Four times a day (QID) | ORAL | 0 refills | Status: DC | PRN

## 2015-12-01 MED ORDER — LIDOCAINE HCL 1 % IJ SOLN
1.0000 mL | INTRAMUSCULAR | Status: AC | PRN
  Administered 2015-12-01: 1 mL

## 2015-12-01 MED ORDER — METHYLPREDNISOLONE ACETATE 40 MG/ML IJ SUSP
40.0000 mg | INTRAMUSCULAR | Status: AC | PRN
  Administered 2015-12-01: 40 mg
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
## 2016-01-07 DIAGNOSIS — F332 Major depressive disorder, recurrent severe without psychotic features: Secondary | ICD-10-CM | POA: Diagnosis not present

## 2016-01-31 ENCOUNTER — Ambulatory Visit (INDEPENDENT_AMBULATORY_CARE_PROVIDER_SITE_OTHER): Payer: PPO | Admitting: Physician Assistant

## 2016-01-31 DIAGNOSIS — F332 Major depressive disorder, recurrent severe without psychotic features: Secondary | ICD-10-CM | POA: Diagnosis not present

## 2016-02-02 DIAGNOSIS — F332 Major depressive disorder, recurrent severe without psychotic features: Secondary | ICD-10-CM | POA: Diagnosis not present

## 2016-02-07 ENCOUNTER — Ambulatory Visit (INDEPENDENT_AMBULATORY_CARE_PROVIDER_SITE_OTHER): Payer: PPO | Admitting: Physician Assistant

## 2016-02-07 ENCOUNTER — Encounter (INDEPENDENT_AMBULATORY_CARE_PROVIDER_SITE_OTHER): Payer: Self-pay | Admitting: Physician Assistant

## 2016-02-07 VITALS — Ht 65.0 in | Wt 200.0 lb

## 2016-02-07 DIAGNOSIS — G5601 Carpal tunnel syndrome, right upper limb: Secondary | ICD-10-CM | POA: Diagnosis not present

## 2016-02-07 DIAGNOSIS — G5602 Carpal tunnel syndrome, left upper limb: Secondary | ICD-10-CM
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
## 2016-03-09 ENCOUNTER — Other Ambulatory Visit: Payer: Medicaid Other

## 2016-03-20 DIAGNOSIS — F332 Major depressive disorder, recurrent severe without psychotic features: Secondary | ICD-10-CM | POA: Diagnosis not present

## 2016-03-21 ENCOUNTER — Other Ambulatory Visit: Payer: Self-pay

## 2016-03-23 ENCOUNTER — Other Ambulatory Visit: Payer: PPO

## 2016-03-23 ENCOUNTER — Ambulatory Visit: Payer: Self-pay | Admitting: Internal Medicine

## 2016-03-23 DIAGNOSIS — B20 Human immunodeficiency virus [HIV] disease: Secondary | ICD-10-CM

## 2016-03-23 LAB — CBC
HCT: 38.3 % (ref 35.0–45.0)
HEMOGLOBIN: 12.5 g/dL (ref 11.7–15.5)
MCH: 30.3 pg (ref 27.0–33.0)
MCHC: 32.6 g/dL (ref 32.0–36.0)
MCV: 92.7 fL (ref 80.0–100.0)
MPV: 9.3 fL (ref 7.5–12.5)
Platelets: 259 10*3/uL (ref 140–400)
RBC: 4.13 MIL/uL (ref 3.80–5.10)
RDW: 13.6 % (ref 11.0–15.0)
WBC: 5.3 10*3/uL (ref 3.8–10.8)

## 2016-03-23 LAB — COMPREHENSIVE METABOLIC PANEL
ALBUMIN: 4.1 g/dL (ref 3.6–5.1)
ALK PHOS: 48 U/L (ref 33–130)
ALT: 14 U/L (ref 6–29)
AST: 17 U/L (ref 10–35)
BUN: 13 mg/dL (ref 7–25)
CHLORIDE: 104 mmol/L (ref 98–110)
CO2: 27 mmol/L (ref 20–31)
CREATININE: 0.86 mg/dL (ref 0.50–1.05)
Calcium: 9.5 mg/dL (ref 8.6–10.4)
Glucose, Bld: 93 mg/dL (ref 65–99)
Potassium: 4.2 mmol/L (ref 3.5–5.3)
SODIUM: 141 mmol/L (ref 135–146)
TOTAL PROTEIN: 7.1 g/dL (ref 6.1–8.1)
Total Bilirubin: 0.6 mg/dL (ref 0.2–1.2)

## 2016-03-23 LAB — LIPID PANEL
CHOLESTEROL: 219 mg/dL — AB (ref <200)
HDL: 65 mg/dL (ref >50)
LDL Cholesterol: 140 mg/dL — ABNORMAL HIGH (ref <100)
TRIGLYCERIDES: 68 mg/dL (ref <150)
Total CHOL/HDL Ratio: 3.4 Ratio (ref <5.0)
VLDL: 14 mg/dL (ref <30)

## 2016-03-24 LAB — RPR

## 2016-03-24 LAB — T-HELPER CELL (CD4) - (RCID CLINIC ONLY)
CD4 % Helper T Cell: 28 % — ABNORMAL LOW (ref 33–55)
CD4 T CELL ABS: 530 /uL (ref 400–2700)

## 2016-03-25 LAB — HIV-1 RNA QUANT-NO REFLEX-BLD
HIV 1 RNA Quant: <20 copies/mL — AB
HIV-1 RNA QUANT, LOG: DETECTED {Log_copies}/mL — AB

## 2016-04-06 DIAGNOSIS — F332 Major depressive disorder, recurrent severe without psychotic features: Secondary | ICD-10-CM | POA: Diagnosis not present

## 2016-04-11 ENCOUNTER — Encounter: Payer: Self-pay | Admitting: Internal Medicine

## 2016-04-11 ENCOUNTER — Ambulatory Visit (INDEPENDENT_AMBULATORY_CARE_PROVIDER_SITE_OTHER): Payer: PPO | Admitting: Internal Medicine

## 2016-04-11 DIAGNOSIS — B20 Human immunodeficiency virus [HIV] disease: Secondary | ICD-10-CM | POA: Diagnosis not present
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
## 2016-04-11 NOTE — Assessment & Plan Note (Signed)
Her infection is under excellent, long-term control. She will continue Genvoya in follow-up after blood work in 6 months.

## 2016-04-29 ENCOUNTER — Encounter (HOSPITAL_COMMUNITY): Payer: Self-pay | Admitting: Emergency Medicine

## 2016-04-29 ENCOUNTER — Emergency Department (HOSPITAL_COMMUNITY)
Admission: EM | Admit: 2016-04-29 | Discharge: 2016-04-29 | Disposition: A | Discharge status: Home / Self Care | Payer: No Typology Code available for payment source | Attending: Emergency Medicine | Admitting: Emergency Medicine

## 2016-04-29 ENCOUNTER — Emergency Department (HOSPITAL_COMMUNITY): Payer: No Typology Code available for payment source

## 2016-04-29 DIAGNOSIS — Y939 Activity, unspecified: Secondary | ICD-10-CM | POA: Diagnosis not present

## 2016-04-29 DIAGNOSIS — S8011XA Contusion of right lower leg, initial encounter: Secondary | ICD-10-CM | POA: Insufficient documentation

## 2016-04-29 DIAGNOSIS — Y999 Unspecified external cause status: Secondary | ICD-10-CM | POA: Diagnosis not present

## 2016-04-29 DIAGNOSIS — Y92410 Unspecified street and highway as the place of occurrence of the external cause: Secondary | ICD-10-CM | POA: Diagnosis not present

## 2016-04-29 DIAGNOSIS — Z79899 Other long term (current) drug therapy: Secondary | ICD-10-CM | POA: Insufficient documentation

## 2016-04-29 DIAGNOSIS — M25571 Pain in right ankle and joints of right foot: Secondary | ICD-10-CM | POA: Diagnosis not present

## 2016-04-29 DIAGNOSIS — S99911A Unspecified injury of right ankle, initial encounter: Secondary | ICD-10-CM | POA: Diagnosis not present

## 2016-04-29 DIAGNOSIS — S8991XA Unspecified injury of right lower leg, initial encounter: Secondary | ICD-10-CM | POA: Diagnosis not present

## 2016-04-29 MED ORDER — IBUPROFEN 800 MG PO TABS
800.0000 mg | ORAL_TABLET | Freq: Once | ORAL | Status: AC
  Administered 2016-04-29: 800 mg via ORAL
  Filled 2016-04-29: qty 1

## 2016-04-29 MED ORDER — IBUPROFEN 800 MG PO TABS: 800.0000 mg | ORAL_TABLET | Freq: Four times a day (QID) | ORAL | 0 refills | Status: AC
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
## 2016-04-29 NOTE — ED Notes (Signed)
Ice bag given to patient.
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
## 2016-05-03 ENCOUNTER — Telehealth (INDEPENDENT_AMBULATORY_CARE_PROVIDER_SITE_OTHER): Payer: Self-pay | Admitting: Orthopaedic Surgery
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
## 2016-05-03 NOTE — Telephone Encounter (Signed)
Patient scheduled 05/08/16  8:45 am at the [facility] location

## 2016-05-03 NOTE — Telephone Encounter (Signed)
Could you see if she can come to [facility] office Monday? That's unfortunately the best we can do, we are just so booked

## 2016-05-04 ENCOUNTER — Other Ambulatory Visit (INDEPENDENT_AMBULATORY_CARE_PROVIDER_SITE_OTHER): Payer: Self-pay

## 2016-05-04 DIAGNOSIS — M25561 Pain in right knee: Secondary | ICD-10-CM

## 2016-05-08 ENCOUNTER — Telehealth (INDEPENDENT_AMBULATORY_CARE_PROVIDER_SITE_OTHER): Payer: Self-pay | Admitting: Orthopaedic Surgery

## 2016-05-08 ENCOUNTER — Ambulatory Visit (INDEPENDENT_AMBULATORY_CARE_PROVIDER_SITE_OTHER): Payer: PPO | Admitting: Orthopaedic Surgery

## 2016-05-08 ENCOUNTER — Ambulatory Visit (HOSPITAL_BASED_OUTPATIENT_CLINIC_OR_DEPARTMENT_OTHER)
Admission: RE | Admit: 2016-05-08 | Discharge: 2016-05-08 | Disposition: A | Discharge status: Home / Self Care | Payer: PPO | Source: Ambulatory Visit | Attending: Orthopaedic Surgery | Admitting: Orthopaedic Surgery

## 2016-05-08 DIAGNOSIS — S8991XA Unspecified injury of right lower leg, initial encounter: Secondary | ICD-10-CM | POA: Diagnosis not present

## 2016-05-08 DIAGNOSIS — M25561 Pain in right knee: Secondary | ICD-10-CM | POA: Diagnosis not present

## 2016-05-08 DIAGNOSIS — M11261 Other chondrocalcinosis, right knee: Secondary | ICD-10-CM | POA: Insufficient documentation

## 2016-05-08 DIAGNOSIS — M1711 Unilateral primary osteoarthritis, right knee: Secondary | ICD-10-CM | POA: Insufficient documentation

## 2016-05-08 DIAGNOSIS — M25571 Pain in right ankle and joints of right foot: Secondary | ICD-10-CM | POA: Diagnosis not present

## 2016-05-08 MED ORDER — MELOXICAM 7.5 MG PO TABS: 7.5000 mg | ORAL_TABLET | Freq: Two times a day (BID) | ORAL | 2 refills | Status: DC | PRN

## 2016-05-08 MED ORDER — METHYLPREDNISOLONE 4 MG PO TABS: ORAL_TABLET | ORAL | 0 refills | Status: DC
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
## 2016-05-09 ENCOUNTER — Telehealth (INDEPENDENT_AMBULATORY_CARE_PROVIDER_SITE_OTHER): Payer: Self-pay | Admitting: Orthopaedic Surgery

## 2016-05-09 NOTE — Telephone Encounter (Signed)
Called into pharmacy

## 2016-05-09 NOTE — Telephone Encounter (Signed)
Returned call to patient she advised the Rx for her knee was not called into the pharmacy yesterday. Patient said the pharmacy is [facility] [address]. The phone is [phone]  The fax# is [phone].  The number to contact patient is [phone]

## 2016-05-09 NOTE — Telephone Encounter (Signed)
ERROR

## 2016-05-22 ENCOUNTER — Ambulatory Visit (INDEPENDENT_AMBULATORY_CARE_PROVIDER_SITE_OTHER): Payer: PPO | Admitting: Orthopaedic Surgery

## 2016-05-22 ENCOUNTER — Encounter (INDEPENDENT_AMBULATORY_CARE_PROVIDER_SITE_OTHER): Payer: Self-pay

## 2016-05-22 DIAGNOSIS — M25561 Pain in right knee: Secondary | ICD-10-CM

## 2016-05-22 DIAGNOSIS — M25571 Pain in right ankle and joints of right foot: Secondary | ICD-10-CM

## 2016-05-22 MED ORDER — GABAPENTIN 300 MG PO CAPS: 300.0000 mg | ORAL_CAPSULE | Freq: Two times a day (BID) | ORAL | 3 refills | Status: AC
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
## 2016-06-09 DIAGNOSIS — F332 Major depressive disorder, recurrent severe without psychotic features: Secondary | ICD-10-CM | POA: Diagnosis not present

## 2016-06-14 DIAGNOSIS — F332 Major depressive disorder, recurrent severe without psychotic features: Secondary | ICD-10-CM | POA: Diagnosis not present

## 2016-09-06 ENCOUNTER — Other Ambulatory Visit: Payer: Self-pay | Admitting: Internal Medicine

## 2016-09-06 DIAGNOSIS — Z1231 Encounter for screening mammogram for malignant neoplasm of breast: Secondary | ICD-10-CM

## 2016-09-13 DIAGNOSIS — Z6835 Body mass index (BMI) 35.0-35.9, adult: Secondary | ICD-10-CM | POA: Diagnosis not present

## 2016-09-13 DIAGNOSIS — M2550 Pain in unspecified joint: Secondary | ICD-10-CM | POA: Diagnosis not present

## 2016-09-13 DIAGNOSIS — R5383 Other fatigue: Secondary | ICD-10-CM | POA: Diagnosis not present

## 2016-09-13 DIAGNOSIS — F332 Major depressive disorder, recurrent severe without psychotic features: Secondary | ICD-10-CM | POA: Diagnosis not present

## 2016-09-13 DIAGNOSIS — N958 Other specified menopausal and perimenopausal disorders: Secondary | ICD-10-CM | POA: Diagnosis not present

## 2016-09-13 DIAGNOSIS — E6609 Other obesity due to excess calories: Secondary | ICD-10-CM | POA: Diagnosis not present

## 2016-09-19 DIAGNOSIS — Z6835 Body mass index (BMI) 35.0-35.9, adult: Secondary | ICD-10-CM | POA: Diagnosis not present

## 2016-09-19 DIAGNOSIS — E6609 Other obesity due to excess calories: Secondary | ICD-10-CM | POA: Diagnosis not present

## 2016-09-22 ENCOUNTER — Ambulatory Visit: Payer: Self-pay

## 2016-09-25 ENCOUNTER — Ambulatory Visit
Admission: RE | Admit: 2016-09-25 | Discharge: 2016-09-25 | Disposition: A | Discharge status: Home / Self Care | Payer: PPO | Source: Ambulatory Visit | Attending: Internal Medicine | Admitting: Internal Medicine

## 2016-09-25 DIAGNOSIS — Z1231 Encounter for screening mammogram for malignant neoplasm of breast: Secondary | ICD-10-CM | POA: Diagnosis not present

## 2016-09-26 DIAGNOSIS — E663 Overweight: Secondary | ICD-10-CM | POA: Diagnosis not present

## 2016-09-26 DIAGNOSIS — Z1211 Encounter for screening for malignant neoplasm of colon: Secondary | ICD-10-CM | POA: Diagnosis not present

## 2016-09-26 DIAGNOSIS — Z23 Encounter for immunization: Secondary | ICD-10-CM | POA: Diagnosis not present

## 2016-09-26 DIAGNOSIS — Z6834 Body mass index (BMI) 34.0-34.9, adult: Secondary | ICD-10-CM | POA: Diagnosis not present

## 2016-09-26 DIAGNOSIS — B20 Human immunodeficiency virus [HIV] disease: Secondary | ICD-10-CM | POA: Diagnosis not present

## 2016-09-26 DIAGNOSIS — Z7689 Persons encountering health services in other specified circumstances: Secondary | ICD-10-CM | POA: Diagnosis not present

## 2016-09-26 DIAGNOSIS — R0981 Nasal congestion: Secondary | ICD-10-CM | POA: Diagnosis not present

## 2016-09-27 DIAGNOSIS — F332 Major depressive disorder, recurrent severe without psychotic features: Secondary | ICD-10-CM | POA: Diagnosis not present

## 2016-10-03 DIAGNOSIS — Z6834 Body mass index (BMI) 34.0-34.9, adult: Secondary | ICD-10-CM | POA: Diagnosis not present

## 2016-10-03 DIAGNOSIS — E663 Overweight: Secondary | ICD-10-CM | POA: Diagnosis not present

## 2016-10-11 DIAGNOSIS — E669 Obesity, unspecified: Secondary | ICD-10-CM | POA: Diagnosis not present

## 2016-10-11 DIAGNOSIS — Z6834 Body mass index (BMI) 34.0-34.9, adult: Secondary | ICD-10-CM | POA: Diagnosis not present

## 2016-10-17 DIAGNOSIS — Z6834 Body mass index (BMI) 34.0-34.9, adult: Secondary | ICD-10-CM | POA: Diagnosis not present

## 2016-10-17 DIAGNOSIS — E663 Overweight: Secondary | ICD-10-CM | POA: Diagnosis not present

## 2016-10-24 DIAGNOSIS — M545 Low back pain: Secondary | ICD-10-CM | POA: Diagnosis not present

## 2016-10-24 DIAGNOSIS — R0981 Nasal congestion: Secondary | ICD-10-CM | POA: Diagnosis not present

## 2016-10-24 DIAGNOSIS — M2550 Pain in unspecified joint: Secondary | ICD-10-CM | POA: Diagnosis not present

## 2016-10-24 DIAGNOSIS — E6609 Other obesity due to excess calories: Secondary | ICD-10-CM | POA: Diagnosis not present

## 2016-10-24 DIAGNOSIS — Z1211 Encounter for screening for malignant neoplasm of colon: Secondary | ICD-10-CM | POA: Diagnosis not present

## 2016-10-31 DIAGNOSIS — E669 Obesity, unspecified: Secondary | ICD-10-CM | POA: Diagnosis not present

## 2016-10-31 DIAGNOSIS — Z6832 Body mass index (BMI) 32.0-32.9, adult: Secondary | ICD-10-CM | POA: Diagnosis not present

## 2016-11-02 DIAGNOSIS — F4310 Post-traumatic stress disorder, unspecified: Secondary | ICD-10-CM | POA: Diagnosis not present

## 2016-11-07 DIAGNOSIS — E6609 Other obesity due to excess calories: Secondary | ICD-10-CM | POA: Diagnosis not present

## 2016-11-07 DIAGNOSIS — Z6832 Body mass index (BMI) 32.0-32.9, adult: Secondary | ICD-10-CM | POA: Diagnosis not present

## 2016-11-24 ENCOUNTER — Other Ambulatory Visit: Payer: Self-pay

## 2016-11-24 NOTE — Patient Outreach (Signed)
[facility]) Care Management  11/24/2016  Anne Joyce [birth year] [id number]   Telephone call to patient for referral from [place].  No answer.  HIPAA compliant voice message left.  Plan: RN CM will attempt patient again within 5 business days.   Jone Baseman, RN, MSN [facility] Care Management Care Management Coordinator Direct Line [phone] Toll Free: [phone]  Fax: [phone]

## 2016-11-27 ENCOUNTER — Other Ambulatory Visit: Payer: Self-pay

## 2016-11-27 NOTE — Patient Outreach (Signed)
[facility]) Care Management  11/27/2016  Anne Joyce [birth year] [id number]   Telephone call to patient for follow up from [place].  Spoke with patient. She is able to verify HIPAA.  Discussed with patient reason for call.  Patient needing assistance in knowing if her dentist and eye doctor are in network. Looked up doctors for patient and gave names of other doctors she may need to use. Patient also requests information about advanced directives.  Advised patient that I would send that information to her along with information on [place]. Discussed with patient [place] Management services.  Patient declined services at this time.  Plan: RN CM will send advanced directive information to patient along with [place] Management information. RN CM will close case at this time and notify care management assistant of case status.  \  Jone Baseman, RN, MSN [place] Management Care Management Coordinator Direct Line [phone] Toll Free: [phone]  Fax: [phone]

## 2016-12-19 ENCOUNTER — Ambulatory Visit (INDEPENDENT_AMBULATORY_CARE_PROVIDER_SITE_OTHER): Payer: PPO | Admitting: Orthopaedic Surgery

## 2016-12-20 ENCOUNTER — Ambulatory Visit (INDEPENDENT_AMBULATORY_CARE_PROVIDER_SITE_OTHER): Payer: PPO | Admitting: Orthopaedic Surgery

## 2016-12-20 ENCOUNTER — Encounter (INDEPENDENT_AMBULATORY_CARE_PROVIDER_SITE_OTHER): Payer: Self-pay | Admitting: Orthopaedic Surgery

## 2016-12-20 DIAGNOSIS — G5601 Carpal tunnel syndrome, right upper limb: Secondary | ICD-10-CM | POA: Diagnosis not present

## 2016-12-20 DIAGNOSIS — G5602 Carpal tunnel syndrome, left upper limb: Secondary | ICD-10-CM
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
## 2016-12-20 NOTE — Progress Notes (Signed)
I was able to see and examine the patient today as well and we went over her nerve conduction studies.  She does have moderate bilateral carpal tunnel syndrome.  I agree with Benita Stabile, PA-C's note with assessment and plan.  She would like to proceed with a left carpal tunnel release in January 2019.  We had a long and thorough discussion about this and I talked about the anatomy of the transverse carpal ligament and we had a discussion of the risk and benefits surgery.  We will give her a call about having this scheduled and then we will see her back in 2 weeks postoperative for suture removal.  All questions and concerns were answered and addressed.

## 2016-12-21 ENCOUNTER — Other Ambulatory Visit: Payer: Self-pay | Admitting: Internal Medicine

## 2016-12-21 DIAGNOSIS — B20 Human immunodeficiency virus [HIV] disease: Secondary | ICD-10-CM

## 2017-01-11 DIAGNOSIS — E669 Obesity, unspecified: Secondary | ICD-10-CM | POA: Diagnosis not present

## 2017-01-18 DIAGNOSIS — Z6833 Body mass index (BMI) 33.0-33.9, adult: Secondary | ICD-10-CM | POA: Diagnosis not present

## 2017-01-18 DIAGNOSIS — E663 Overweight: Secondary | ICD-10-CM | POA: Diagnosis not present

## 2017-01-25 DIAGNOSIS — G5602 Carpal tunnel syndrome, left upper limb: Secondary | ICD-10-CM | POA: Diagnosis not present

## 2017-02-08 ENCOUNTER — Encounter (INDEPENDENT_AMBULATORY_CARE_PROVIDER_SITE_OTHER): Payer: Self-pay | Admitting: Orthopaedic Surgery

## 2017-02-08 ENCOUNTER — Ambulatory Visit (INDEPENDENT_AMBULATORY_CARE_PROVIDER_SITE_OTHER): Payer: PPO | Admitting: Orthopaedic Surgery

## 2017-02-08 DIAGNOSIS — Z9889 Other specified postprocedural states: Secondary | ICD-10-CM

## 2017-02-08 NOTE — Progress Notes (Signed)
Patient is 2 weeks status post a left open carpal tunnel release.  She says she is doing well and the numbness and tingling is gone away seizure disorder incision.  On examination I removed the sutures and placed Steri-Strips from her left hand incision.  Looks good overall.  He has no numbness in her hand is able to move her fingers and thumb.  This point to slowly increase her activities as comfort allows.  She will avoid heavy gripping and lifting with that hand until further notice.  I would like to see her back in 4 weeks for repeat exam.  All questions concerns were answered and addressed.

## 2017-02-22 DIAGNOSIS — Z6832 Body mass index (BMI) 32.0-32.9, adult: Secondary | ICD-10-CM | POA: Diagnosis not present

## 2017-02-22 DIAGNOSIS — R5383 Other fatigue: Secondary | ICD-10-CM | POA: Diagnosis not present

## 2017-02-22 DIAGNOSIS — M2550 Pain in unspecified joint: Secondary | ICD-10-CM | POA: Diagnosis not present

## 2017-02-22 DIAGNOSIS — N958 Other specified menopausal and perimenopausal disorders: Secondary | ICD-10-CM | POA: Diagnosis not present

## 2017-03-01 DIAGNOSIS — Z6835 Body mass index (BMI) 35.0-35.9, adult: Secondary | ICD-10-CM | POA: Diagnosis not present

## 2017-03-01 DIAGNOSIS — Z713 Dietary counseling and surveillance: Secondary | ICD-10-CM | POA: Diagnosis not present

## 2017-03-01 DIAGNOSIS — M2550 Pain in unspecified joint: Secondary | ICD-10-CM | POA: Diagnosis not present

## 2017-03-02 DIAGNOSIS — F4310 Post-traumatic stress disorder, unspecified: Secondary | ICD-10-CM | POA: Diagnosis not present

## 2017-03-06 DIAGNOSIS — F332 Major depressive disorder, recurrent severe without psychotic features: Secondary | ICD-10-CM | POA: Diagnosis not present

## 2017-03-09 DIAGNOSIS — Z713 Dietary counseling and surveillance: Secondary | ICD-10-CM | POA: Diagnosis not present

## 2017-03-09 DIAGNOSIS — Z6834 Body mass index (BMI) 34.0-34.9, adult: Secondary | ICD-10-CM | POA: Diagnosis not present

## 2017-03-09 DIAGNOSIS — M2550 Pain in unspecified joint: Secondary | ICD-10-CM | POA: Diagnosis not present

## 2017-03-12 ENCOUNTER — Ambulatory Visit (INDEPENDENT_AMBULATORY_CARE_PROVIDER_SITE_OTHER): Payer: PPO | Admitting: Orthopaedic Surgery

## 2017-03-12 ENCOUNTER — Encounter (INDEPENDENT_AMBULATORY_CARE_PROVIDER_SITE_OTHER): Payer: Self-pay | Admitting: Orthopaedic Surgery

## 2017-03-12 DIAGNOSIS — G5601 Carpal tunnel syndrome, right upper limb: Secondary | ICD-10-CM

## 2017-03-12 DIAGNOSIS — G5602 Carpal tunnel syndrome, left upper limb: Secondary | ICD-10-CM

## 2017-03-12 NOTE — Progress Notes (Signed)
The patient is now almost 2 months status post a left open carpal tunnel release.  She said that her left side is doing great and she is very satisfied and pleased.  On exam she does have still some slight weak grip and pinch strength but is improved significantly overall.  Her sensation is almost normal.  She does have known carpal tunnel syndrome on the right upper extremity and she says she wants that addressed potentially later this year.  All questions and concerns were answered and addressed.  She will follow-up as needed for at least until she would like to have a right open carpal tunnel release.  She will let us know.

## 2017-09-06 DIAGNOSIS — R0602 Shortness of breath: Secondary | ICD-10-CM | POA: Diagnosis not present

## 2017-09-06 DIAGNOSIS — R5383 Other fatigue: Secondary | ICD-10-CM | POA: Diagnosis not present

## 2017-09-06 DIAGNOSIS — R635 Abnormal weight gain: Secondary | ICD-10-CM | POA: Diagnosis not present

## 2017-09-06 DIAGNOSIS — Z90710 Acquired absence of both cervix and uterus: Secondary | ICD-10-CM | POA: Diagnosis not present

## 2017-09-20 ENCOUNTER — Other Ambulatory Visit: Payer: Self-pay | Admitting: Internal Medicine

## 2017-09-20 ENCOUNTER — Other Ambulatory Visit: Payer: Self-pay | Admitting: Family

## 2017-09-20 DIAGNOSIS — Z1231 Encounter for screening mammogram for malignant neoplasm of breast: Secondary | ICD-10-CM

## 2017-09-27 ENCOUNTER — Other Ambulatory Visit: Payer: Self-pay | Admitting: *Deleted

## 2017-09-27 ENCOUNTER — Other Ambulatory Visit: Payer: Self-pay

## 2017-09-27 DIAGNOSIS — Z113 Encounter for screening for infections with a predominantly sexual mode of transmission: Secondary | ICD-10-CM

## 2017-09-27 DIAGNOSIS — B20 Human immunodeficiency virus [HIV] disease: Secondary | ICD-10-CM

## 2017-10-02 ENCOUNTER — Other Ambulatory Visit: Payer: Self-pay

## 2017-10-02 DIAGNOSIS — Z124 Encounter for screening for malignant neoplasm of cervix: Secondary | ICD-10-CM | POA: Diagnosis not present

## 2017-10-02 DIAGNOSIS — Z1151 Encounter for screening for human papillomavirus (HPV): Secondary | ICD-10-CM | POA: Diagnosis not present

## 2017-10-02 DIAGNOSIS — Z90710 Acquired absence of both cervix and uterus: Secondary | ICD-10-CM | POA: Diagnosis not present

## 2017-10-02 DIAGNOSIS — Z0000 Encounter for general adult medical examination without abnormal findings: Secondary | ICD-10-CM | POA: Diagnosis not present

## 2017-10-04 ENCOUNTER — Other Ambulatory Visit: Payer: PPO

## 2017-10-04 DIAGNOSIS — Z113 Encounter for screening for infections with a predominantly sexual mode of transmission: Secondary | ICD-10-CM

## 2017-10-04 DIAGNOSIS — B20 Human immunodeficiency virus [HIV] disease: Secondary | ICD-10-CM

## 2017-10-05 LAB — T-HELPER CELL (CD4) - (RCID CLINIC ONLY)
CD4 T CELL ABS: 700 /uL (ref 400–2700)
CD4 T CELL HELPER: 26 % — AB (ref 33–55)

## 2017-10-08 DIAGNOSIS — R635 Abnormal weight gain: Secondary | ICD-10-CM | POA: Diagnosis not present

## 2017-10-08 DIAGNOSIS — R0602 Shortness of breath: Secondary | ICD-10-CM | POA: Diagnosis not present

## 2017-10-08 DIAGNOSIS — R5383 Other fatigue: Secondary | ICD-10-CM | POA: Diagnosis not present

## 2017-10-08 LAB — COMPLETE METABOLIC PANEL WITH GFR
AG RATIO: 1.3 (calc) (ref 1.0–2.5)
ALT: 13 U/L (ref 6–29)
AST: 16 U/L (ref 10–35)
Albumin: 4.1 g/dL (ref 3.6–5.1)
Alkaline phosphatase (APISO): 61 U/L (ref 33–130)
BILIRUBIN TOTAL: 0.4 mg/dL (ref 0.2–1.2)
BUN: 12 mg/dL (ref 7–25)
CHLORIDE: 102 mmol/L (ref 98–110)
CO2: 28 mmol/L (ref 20–32)
Calcium: 9.7 mg/dL (ref 8.6–10.4)
Creat: 0.89 mg/dL (ref 0.50–1.05)
GFR, EST AFRICAN AMERICAN: 86 mL/min/{1.73_m2} (ref >=60)
GFR, Est Non African American: 75 mL/min/{1.73_m2} (ref >=60)
GLOBULIN: 3.2 g/dL (ref 1.9–3.7)
Glucose, Bld: 119 mg/dL — ABNORMAL HIGH (ref 65–99)
POTASSIUM: 3.9 mmol/L (ref 3.5–5.3)
SODIUM: 138 mmol/L (ref 135–146)
Total Protein: 7.3 g/dL (ref 6.1–8.1)

## 2017-10-08 LAB — CBC WITH DIFFERENTIAL/PLATELET
BASOS ABS: 28 {cells}/uL (ref 0–200)
Basophils Relative: 0.6 %
EOS ABS: 51 {cells}/uL (ref 15–500)
EOS PCT: 1.1 %
HCT: 39 % (ref 35.0–45.0)
Hemoglobin: 13.2 g/dL (ref 11.7–15.5)
Lymphs Abs: 2544 cells/uL (ref 850–3900)
MCH: 30.3 pg (ref 27.0–33.0)
MCHC: 33.8 g/dL (ref 32.0–36.0)
MCV: 89.7 fL (ref 80.0–100.0)
MONOS PCT: 7.1 %
MPV: 10 fL (ref 7.5–12.5)
Neutro Abs: 1651 cells/uL (ref 1500–7800)
Neutrophils Relative %: 35.9 %
PLATELETS: 258 10*3/uL (ref 140–400)
RBC: 4.35 10*6/uL (ref 3.80–5.10)
RDW: 12.6 % (ref 11.0–15.0)
TOTAL LYMPHOCYTE: 55.3 %
WBC mixed population: 327 cells/uL (ref 200–950)
WBC: 4.6 10*3/uL (ref 3.8–10.8)

## 2017-10-08 LAB — RPR: RPR Ser Ql: NONREACTIVE

## 2017-10-08 LAB — HIV-1 RNA QUANT-NO REFLEX-BLD
HIV 1 RNA QUANT: NOT DETECTED {copies}/mL
HIV-1 RNA Quant, Log: <1.3 Log copies/mL

## 2017-10-18 ENCOUNTER — Encounter: Payer: Self-pay | Admitting: Internal Medicine

## 2017-10-22 ENCOUNTER — Ambulatory Visit (INDEPENDENT_AMBULATORY_CARE_PROVIDER_SITE_OTHER): Payer: PPO | Admitting: Internal Medicine

## 2017-10-22 ENCOUNTER — Encounter: Payer: Self-pay | Admitting: Internal Medicine

## 2017-10-22 DIAGNOSIS — B20 Human immunodeficiency virus [HIV] disease: Secondary | ICD-10-CM | POA: Diagnosis not present

## 2017-10-22 DIAGNOSIS — Z23 Encounter for immunization: Secondary | ICD-10-CM

## 2017-10-22 NOTE — Assessment & Plan Note (Signed)
Her infection has been under excellent long-term control since restarting antiretroviral therapy 2-1/2 years ago.  She received her influenza vaccine today.  She will continue Genvoya and follow-up after lab work in 6 months.
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
## 2017-10-23 ENCOUNTER — Ambulatory Visit: Payer: Self-pay

## 2017-10-23 ENCOUNTER — Encounter: Payer: Self-pay | Admitting: Internal Medicine

## 2017-11-05 ENCOUNTER — Ambulatory Visit
Admission: RE | Admit: 2017-11-05 | Discharge: 2017-11-05 | Disposition: A | Discharge status: Home / Self Care | Payer: PPO | Source: Ambulatory Visit | Attending: Family | Admitting: Family

## 2017-11-05 DIAGNOSIS — Z1231 Encounter for screening mammogram for malignant neoplasm of breast: Secondary | ICD-10-CM | POA: Diagnosis not present

## 2017-11-20 DIAGNOSIS — R635 Abnormal weight gain: Secondary | ICD-10-CM | POA: Diagnosis not present

## 2017-11-20 DIAGNOSIS — F332 Major depressive disorder, recurrent severe without psychotic features: Secondary | ICD-10-CM | POA: Diagnosis not present

## 2017-11-22 DIAGNOSIS — K6389 Other specified diseases of intestine: Secondary | ICD-10-CM | POA: Diagnosis not present

## 2017-11-22 DIAGNOSIS — K640 First degree hemorrhoids: Secondary | ICD-10-CM | POA: Diagnosis not present

## 2017-11-22 DIAGNOSIS — Z1211 Encounter for screening for malignant neoplasm of colon: Secondary | ICD-10-CM | POA: Diagnosis not present

## 2017-12-13 ENCOUNTER — Encounter: Payer: Self-pay | Admitting: Infectious Diseases

## 2017-12-13 NOTE — Progress Notes (Signed)
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 No history of abnormal pap smears prior to requiring intervention. Can defer further screenings.   Janene Madeira, MSN, NP-C [facility] for Infectious Disease [email or web address] Pager: [phone] Office: [phone]

## 2017-12-27 ENCOUNTER — Other Ambulatory Visit: Payer: Self-pay | Admitting: Internal Medicine

## 2017-12-27 DIAGNOSIS — B20 Human immunodeficiency virus [HIV] disease: Secondary | ICD-10-CM

## 2017-12-27 DIAGNOSIS — R635 Abnormal weight gain: Secondary | ICD-10-CM | POA: Diagnosis not present

## 2018-02-01 DIAGNOSIS — R635 Abnormal weight gain: Secondary | ICD-10-CM | POA: Diagnosis not present

## 2018-03-04 DIAGNOSIS — R635 Abnormal weight gain: Secondary | ICD-10-CM | POA: Diagnosis not present

## 2018-04-09 ENCOUNTER — Other Ambulatory Visit: Payer: Self-pay

## 2018-04-09 ENCOUNTER — Other Ambulatory Visit: Payer: PPO

## 2018-04-09 DIAGNOSIS — B20 Human immunodeficiency virus [HIV] disease: Secondary | ICD-10-CM

## 2018-04-10 LAB — T-HELPER CELL (CD4) - (RCID CLINIC ONLY)
CD4 % Helper T Cell: 30 % — ABNORMAL LOW (ref 33–55)
CD4 T CELL ABS: 660 /uL (ref 400–2700)

## 2018-04-18 LAB — LIPID PANEL
Cholesterol: 248 mg/dL — ABNORMAL HIGH (ref <200)
HDL: 61 mg/dL (ref >=50)
LDL Cholesterol (Calc): 165 mg/dL (calc) — ABNORMAL HIGH
NON-HDL CHOLESTEROL (CALC): 187 mg/dL — AB (ref <130)
Total CHOL/HDL Ratio: 4.1 (calc) (ref <5.0)
Triglycerides: 103 mg/dL (ref <150)

## 2018-04-18 LAB — COMPREHENSIVE METABOLIC PANEL
AG Ratio: 1.3 (calc) (ref 1.0–2.5)
ALBUMIN MSPROF: 4.3 g/dL (ref 3.6–5.1)
ALT: 11 U/L (ref 6–29)
AST: 14 U/L (ref 10–35)
Alkaline phosphatase (APISO): 59 U/L (ref 37–153)
BUN: 10 mg/dL (ref 7–25)
CO2: 30 mmol/L (ref 20–32)
CREATININE: 1 mg/dL (ref 0.50–1.05)
Calcium: 9.8 mg/dL (ref 8.6–10.4)
Chloride: 100 mmol/L (ref 98–110)
GLOBULIN: 3.3 g/dL (ref 1.9–3.7)
GLUCOSE: 100 mg/dL — AB (ref 65–99)
POTASSIUM: 4.4 mmol/L (ref 3.5–5.3)
SODIUM: 138 mmol/L (ref 135–146)
TOTAL PROTEIN: 7.6 g/dL (ref 6.1–8.1)
Total Bilirubin: 0.5 mg/dL (ref 0.2–1.2)

## 2018-04-18 LAB — CBC
HEMATOCRIT: 38.8 % (ref 35.0–45.0)
Hemoglobin: 13.2 g/dL (ref 11.7–15.5)
MCH: 30.8 pg (ref 27.0–33.0)
MCHC: 34 g/dL (ref 32.0–36.0)
MCV: 90.7 fL (ref 80.0–100.0)
MPV: 9.9 fL (ref 7.5–12.5)
Platelets: 258 10*3/uL (ref 140–400)
RBC: 4.28 10*6/uL (ref 3.80–5.10)
RDW: 12.2 % (ref 11.0–15.0)
WBC: 4.6 10*3/uL (ref 3.8–10.8)

## 2018-04-18 LAB — HIV-1 RNA QUANT-NO REFLEX-BLD
HIV 1 RNA QUANT: NOT DETECTED {copies}/mL
HIV-1 RNA QUANT, LOG: NOT DETECTED {Log_copies}/mL

## 2018-04-18 LAB — RPR: RPR: NONREACTIVE

## 2018-04-30 ENCOUNTER — Ambulatory Visit: Payer: PPO | Admitting: Internal Medicine

## 2018-05-03 DIAGNOSIS — F332 Major depressive disorder, recurrent severe without psychotic features: Secondary | ICD-10-CM | POA: Diagnosis not present

## 2018-05-14 ENCOUNTER — Ambulatory Visit: Payer: PPO | Admitting: Internal Medicine

## 2018-11-20 ENCOUNTER — Other Ambulatory Visit: Payer: Self-pay | Admitting: Family Medicine

## 2018-11-20 ENCOUNTER — Other Ambulatory Visit: Payer: Self-pay | Admitting: Family

## 2018-11-20 DIAGNOSIS — Z1231 Encounter for screening mammogram for malignant neoplasm of breast: Secondary | ICD-10-CM

## 2018-11-21 ENCOUNTER — Other Ambulatory Visit: Payer: Self-pay

## 2018-11-21 ENCOUNTER — Ambulatory Visit
Admission: RE | Admit: 2018-11-21 | Discharge: 2018-11-21 | Disposition: A | Discharge status: Home / Self Care | Payer: PPO | Source: Ambulatory Visit | Attending: Family Medicine | Admitting: Family Medicine

## 2018-11-21 DIAGNOSIS — Z1231 Encounter for screening mammogram for malignant neoplasm of breast: Secondary | ICD-10-CM | POA: Diagnosis not present

## 2018-11-22 DIAGNOSIS — F332 Major depressive disorder, recurrent severe without psychotic features: Secondary | ICD-10-CM | POA: Diagnosis not present

## 2018-12-09 ENCOUNTER — Other Ambulatory Visit: Payer: Self-pay | Admitting: *Deleted

## 2018-12-09 ENCOUNTER — Other Ambulatory Visit: Payer: Self-pay | Admitting: Internal Medicine

## 2018-12-09 DIAGNOSIS — B20 Human immunodeficiency virus [HIV] disease: Secondary | ICD-10-CM

## 2018-12-09 MED ORDER — GENVOYA 150-150-200-10 MG PO TABS: 1.0000 | ORAL_TABLET | Freq: Every day | ORAL | 0 refills | Status: DC

## 2018-12-10 ENCOUNTER — Other Ambulatory Visit: Payer: Self-pay | Admitting: *Deleted

## 2018-12-10 ENCOUNTER — Other Ambulatory Visit: Payer: PPO

## 2018-12-10 ENCOUNTER — Other Ambulatory Visit: Payer: Self-pay

## 2018-12-10 DIAGNOSIS — B20 Human immunodeficiency virus [HIV] disease: Secondary | ICD-10-CM

## 2018-12-11 LAB — T-HELPER CELL (CD4) - (RCID CLINIC ONLY)
CD4 % Helper T Cell: 33 % (ref 33–65)
CD4 T Cell Abs: 951 /uL (ref 400–1790)

## 2018-12-17 DIAGNOSIS — Z7182 Exercise counseling: Secondary | ICD-10-CM | POA: Diagnosis not present

## 2018-12-17 DIAGNOSIS — Z6835 Body mass index (BMI) 35.0-35.9, adult: Secondary | ICD-10-CM | POA: Diagnosis not present

## 2018-12-17 DIAGNOSIS — R799 Abnormal finding of blood chemistry, unspecified: Secondary | ICD-10-CM | POA: Diagnosis not present

## 2018-12-17 DIAGNOSIS — Z0000 Encounter for general adult medical examination without abnormal findings: Secondary | ICD-10-CM | POA: Diagnosis not present

## 2018-12-17 DIAGNOSIS — Z13220 Encounter for screening for lipoid disorders: Secondary | ICD-10-CM | POA: Diagnosis not present

## 2018-12-17 DIAGNOSIS — Z713 Dietary counseling and surveillance: Secondary | ICD-10-CM | POA: Diagnosis not present

## 2018-12-17 DIAGNOSIS — Z124 Encounter for screening for malignant neoplasm of cervix: Secondary | ICD-10-CM | POA: Diagnosis not present

## 2018-12-17 DIAGNOSIS — Z01419 Encounter for gynecological examination (general) (routine) without abnormal findings: Secondary | ICD-10-CM | POA: Diagnosis not present

## 2018-12-17 LAB — HIV-1 RNA QUANT-NO REFLEX-BLD
HIV 1 RNA Quant: <20 copies/mL
HIV-1 RNA Quant, Log: <1.3 Log copies/mL

## 2018-12-24 ENCOUNTER — Telehealth: Payer: Self-pay

## 2018-12-24 NOTE — Telephone Encounter (Signed)
COVID-19 Pre-Screening Questions:12/24/18   Do you currently have a fever (>100 F), chills or unexplained body aches? NO  Are you currently experiencing new cough, shortness of breath, sore throat, runny nose?NO .  Have you recently travelled outside the state of [place] in the last 14 days? NO .  Have you been in contact with someone that is currently pending confirmation of Covid19 testing or has been confirmed to have the [place] virus?  NO   **If the patient answers NO to ALL questions -  advise the patient to please call the clinic before coming to the office should any symptoms develop.

## 2018-12-25 ENCOUNTER — Encounter: Payer: Self-pay | Admitting: Internal Medicine

## 2018-12-25 ENCOUNTER — Ambulatory Visit (INDEPENDENT_AMBULATORY_CARE_PROVIDER_SITE_OTHER): Payer: PPO | Admitting: Internal Medicine

## 2018-12-25 ENCOUNTER — Other Ambulatory Visit: Payer: Self-pay

## 2018-12-25 DIAGNOSIS — B20 Human immunodeficiency virus [HIV] disease: Secondary | ICD-10-CM

## 2018-12-25 DIAGNOSIS — Z23 Encounter for immunization: Secondary | ICD-10-CM | POA: Diagnosis not present
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
## 2018-12-25 NOTE — Assessment & Plan Note (Signed)
Her infection remains under excellent, long-term control.  She received her influenza vaccine today.  She will continue Genvoya and follow-up after lab work in 1 year.

## 2019-01-21 DIAGNOSIS — Z20828 Contact with and (suspected) exposure to other viral communicable diseases: Secondary | ICD-10-CM | POA: Diagnosis not present

## 2019-01-21 DIAGNOSIS — R05 Cough: Secondary | ICD-10-CM | POA: Diagnosis not present

## 2019-01-23 ENCOUNTER — Other Ambulatory Visit: Payer: Self-pay | Admitting: Internal Medicine

## 2019-01-23 DIAGNOSIS — B20 Human immunodeficiency virus [HIV] disease: Secondary | ICD-10-CM

## 2019-03-11 IMAGING — MG DIGITAL SCREENING BILATERAL MAMMOGRAM WITH CAD
4 series · 4 of 4 positions shown · non-contrast
Comparison: Previous exam(s).

CLINICAL DATA: Screening.

EXAM:
DIGITAL SCREENING BILATERAL MAMMOGRAM WITH CAD

[L MLO]
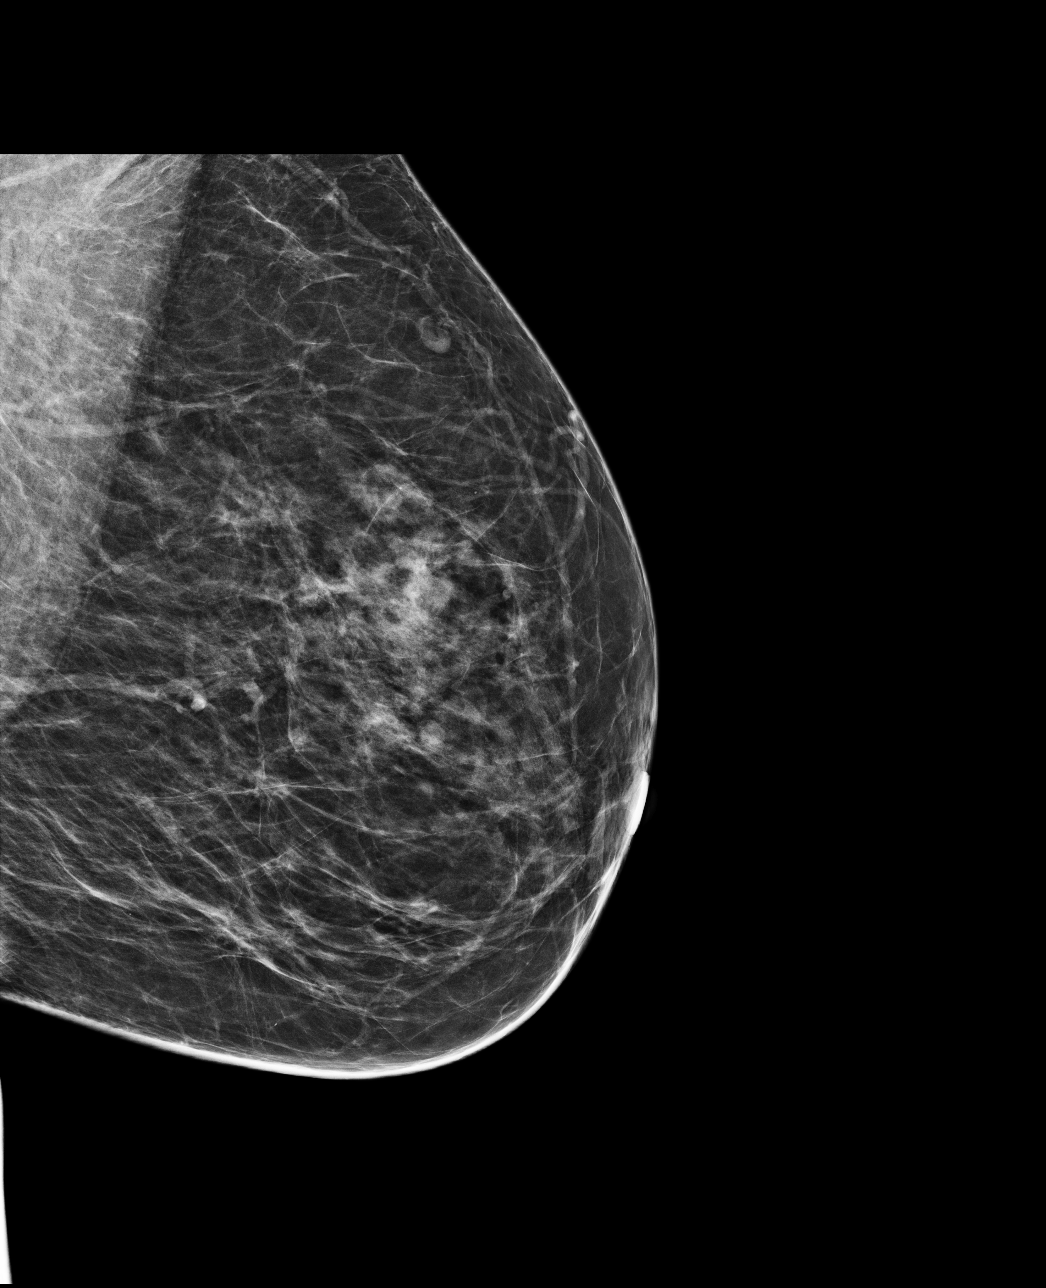

[R CC]
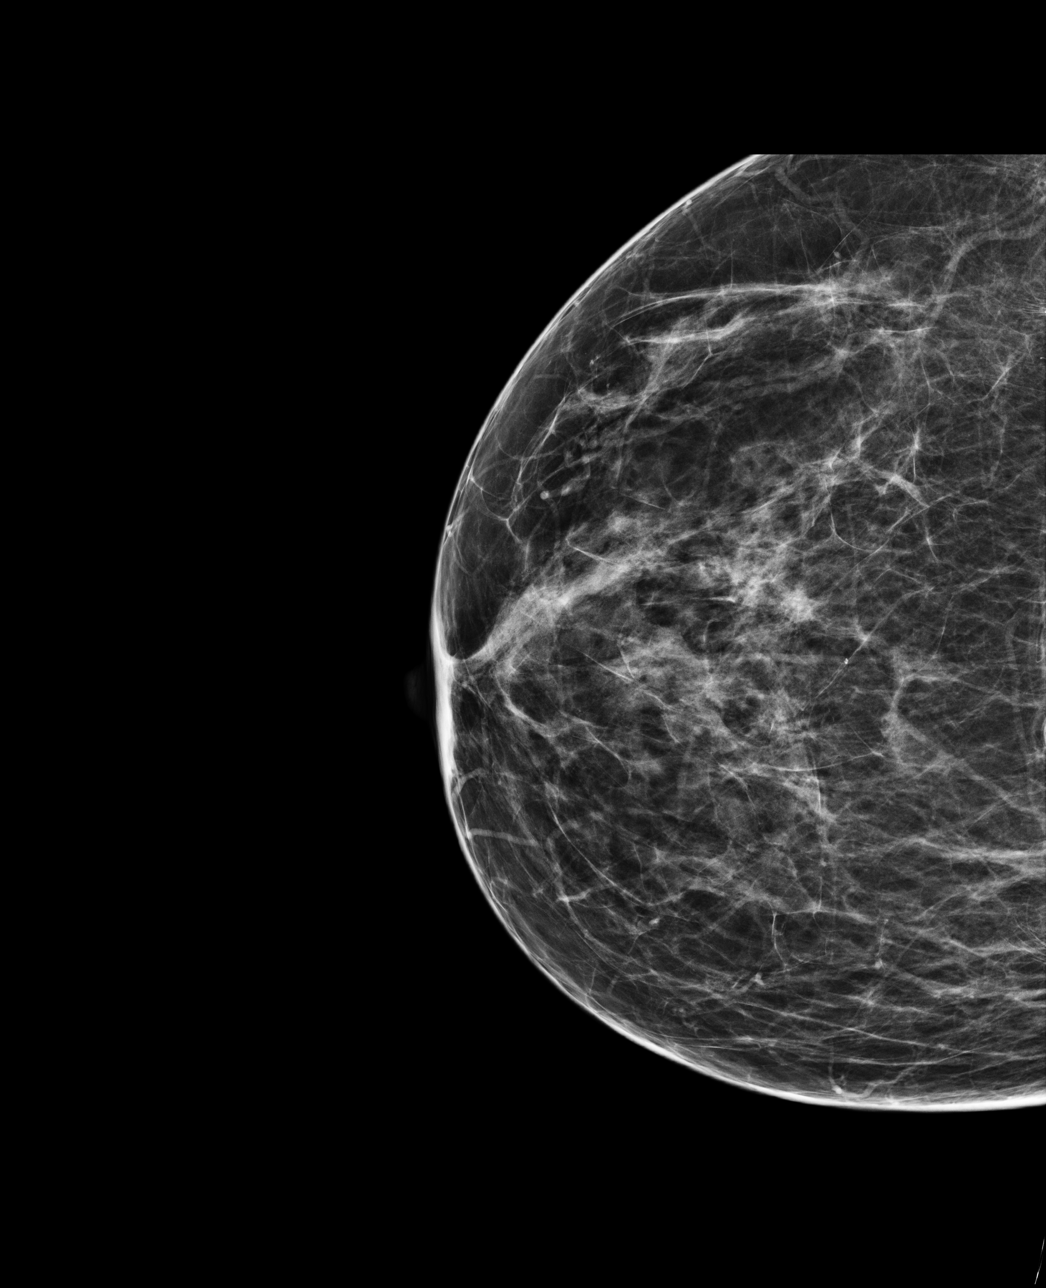

[L CC]
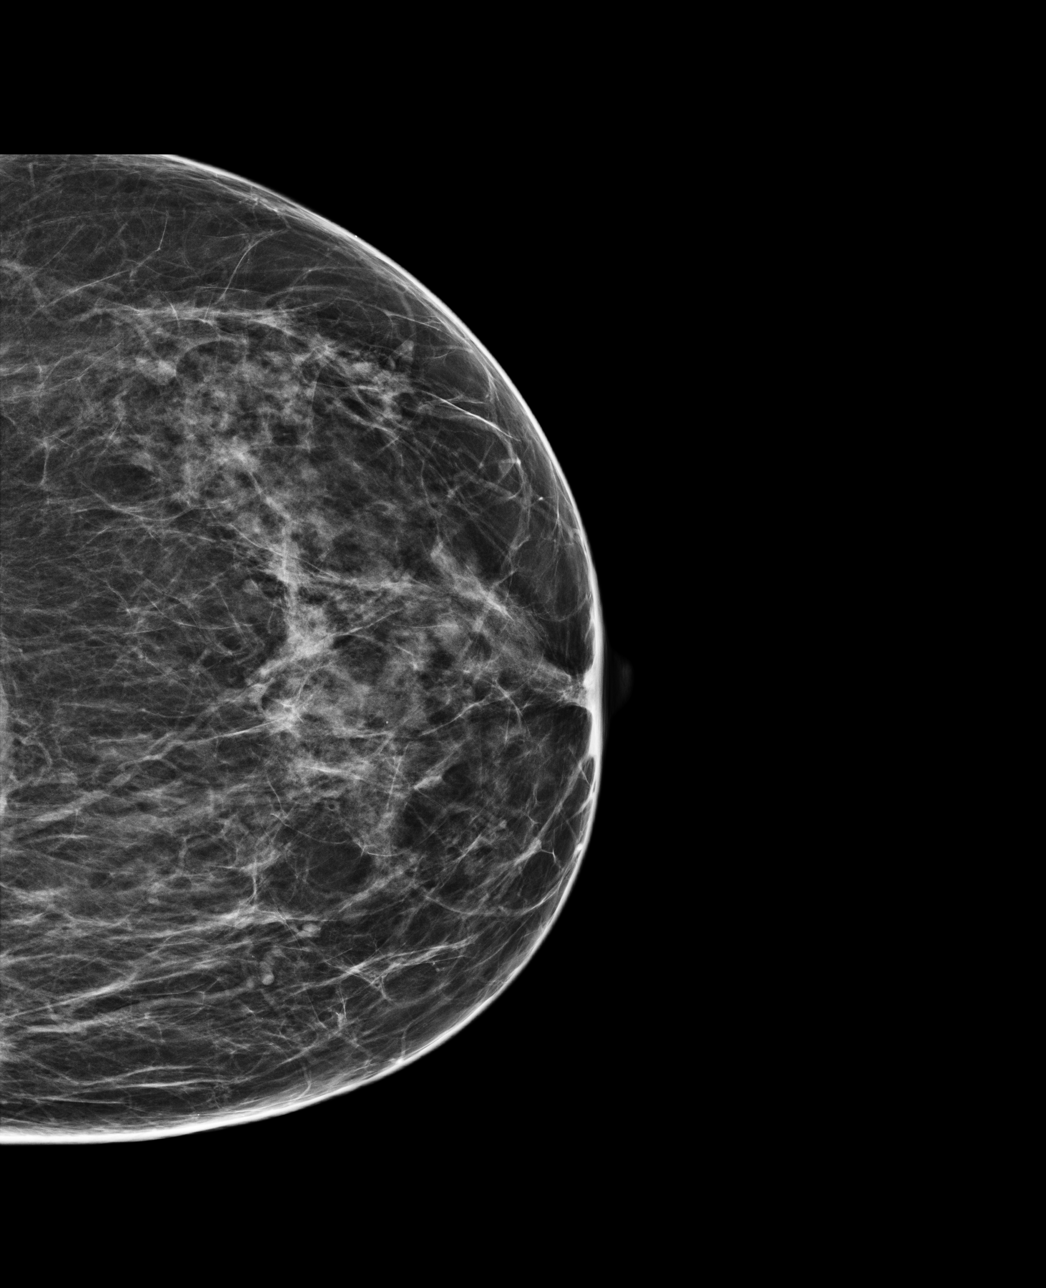

[R MLO]
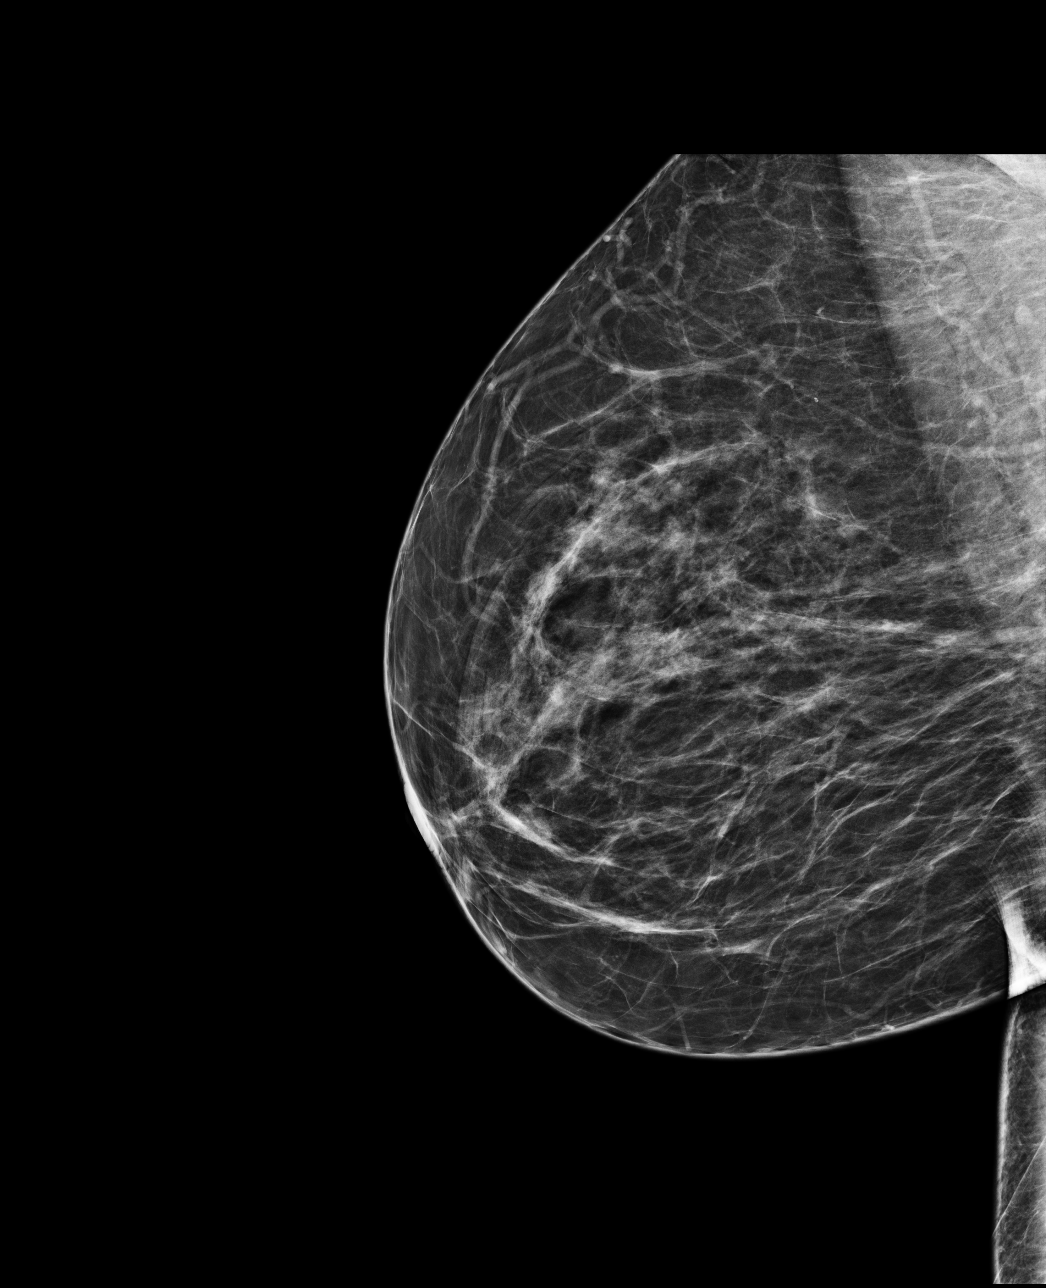

[4 of 4 positions shown; findings below may reference images not displayed]

ACR Breast Density Category c: The breast tissue is heterogeneously
dense, which may obscure small masses.
FINDINGS: There are no findings suspicious for malignancy. Images were
processed with CAD.
IMPRESSION: No mammographic evidence of malignancy. A result letter of this
screening mammogram will be mailed directly to the patient.

RECOMMENDATION:
Screening mammogram in one year. (Code:YJ-[id number])

BI-RADS CATEGORY  1: Negative.

## 2019-08-18 DIAGNOSIS — F331 Major depressive disorder, recurrent, moderate: Secondary | ICD-10-CM | POA: Diagnosis not present

## 2019-08-18 DIAGNOSIS — F4310 Post-traumatic stress disorder, unspecified: Secondary | ICD-10-CM | POA: Diagnosis not present

## 2019-11-05 ENCOUNTER — Other Ambulatory Visit: Payer: Self-pay | Admitting: Family Medicine

## 2019-11-05 DIAGNOSIS — Z1231 Encounter for screening mammogram for malignant neoplasm of breast: Secondary | ICD-10-CM

## 2019-12-12 ENCOUNTER — Ambulatory Visit
Admission: RE | Admit: 2019-12-12 | Discharge: 2019-12-12 | Disposition: A | Discharge status: Home / Self Care | Payer: PPO | Source: Ambulatory Visit | Attending: Family Medicine | Admitting: Family Medicine

## 2019-12-12 ENCOUNTER — Other Ambulatory Visit: Payer: Self-pay

## 2019-12-12 DIAGNOSIS — Z1231 Encounter for screening mammogram for malignant neoplasm of breast: Secondary | ICD-10-CM

## 2019-12-15 ENCOUNTER — Other Ambulatory Visit: Payer: PPO

## 2019-12-15 ENCOUNTER — Other Ambulatory Visit: Payer: Self-pay

## 2019-12-15 DIAGNOSIS — B20 Human immunodeficiency virus [HIV] disease: Secondary | ICD-10-CM

## 2019-12-16 LAB — T-HELPER CELL (CD4) - (RCID CLINIC ONLY)
CD4 % Helper T Cell: 33 % (ref 33–65)
CD4 T Cell Abs: 673 /uL (ref 400–1790)

## 2019-12-17 LAB — COMPREHENSIVE METABOLIC PANEL
AG Ratio: 1.5 (calc) (ref 1.0–2.5)
ALT: 12 U/L (ref 6–29)
AST: 14 U/L (ref 10–35)
Albumin: 4.5 g/dL (ref 3.6–5.1)
Alkaline phosphatase (APISO): 67 U/L (ref 37–153)
BUN: 15 mg/dL (ref 7–25)
CO2: 29 mmol/L (ref 20–32)
Calcium: 9.7 mg/dL (ref 8.6–10.4)
Chloride: 102 mmol/L (ref 98–110)
Creat: 0.87 mg/dL (ref 0.50–1.05)
Globulin: 3.1 g/dL (calc) (ref 1.9–3.7)
Glucose, Bld: 100 mg/dL — ABNORMAL HIGH (ref 65–99)
Potassium: 4.4 mmol/L (ref 3.5–5.3)
Sodium: 140 mmol/L (ref 135–146)
Total Bilirubin: 0.4 mg/dL (ref 0.2–1.2)
Total Protein: 7.6 g/dL (ref 6.1–8.1)

## 2019-12-17 LAB — CBC
HCT: 39.5 % (ref 35.0–45.0)
Hemoglobin: 13 g/dL (ref 11.7–15.5)
MCH: 29.4 pg (ref 27.0–33.0)
MCHC: 32.9 g/dL (ref 32.0–36.0)
MCV: 89.4 fL (ref 80.0–100.0)
MPV: 9.6 fL (ref 7.5–12.5)
Platelets: 266 10*3/uL (ref 140–400)
RBC: 4.42 10*6/uL (ref 3.80–5.10)
RDW: 12.2 % (ref 11.0–15.0)
WBC: 5.3 10*3/uL (ref 3.8–10.8)

## 2019-12-17 LAB — LIPID PANEL
Cholesterol: 229 mg/dL — ABNORMAL HIGH (ref <200)
HDL: 66 mg/dL (ref >=50)
LDL Cholesterol (Calc): 145 mg/dL (calc) — ABNORMAL HIGH
Non-HDL Cholesterol (Calc): 163 mg/dL (calc) — ABNORMAL HIGH (ref <130)
Total CHOL/HDL Ratio: 3.5 (calc) (ref <5.0)
Triglycerides: 76 mg/dL (ref <150)

## 2019-12-17 LAB — RPR: RPR Ser Ql: NONREACTIVE

## 2019-12-17 LAB — HIV-1 RNA QUANT-NO REFLEX-BLD
HIV 1 RNA Quant: <20 Copies/mL
HIV-1 RNA Quant, Log: <1.3 Log cps/mL

## 2019-12-29 ENCOUNTER — Encounter: Payer: PPO | Admitting: Internal Medicine

## 2019-12-31 ENCOUNTER — Other Ambulatory Visit: Payer: Self-pay

## 2019-12-31 ENCOUNTER — Encounter: Payer: Self-pay | Admitting: Internal Medicine

## 2019-12-31 ENCOUNTER — Ambulatory Visit (INDEPENDENT_AMBULATORY_CARE_PROVIDER_SITE_OTHER): Payer: PPO | Admitting: Internal Medicine

## 2019-12-31 VITALS — BP 130/74 | HR 63 | Wt 217.0 lb

## 2019-12-31 DIAGNOSIS — B20 Human immunodeficiency virus [HIV] disease: Secondary | ICD-10-CM | POA: Diagnosis not present

## 2019-12-31 DIAGNOSIS — Z23 Encounter for immunization: Secondary | ICD-10-CM

## 2019-12-31 MED ORDER — GENVOYA 150-150-200-10 MG PO TABS: 1.0000 | ORAL_TABLET | Freq: Every day | ORAL | 11 refills | Status: DC

## 2019-12-31 NOTE — Assessment & Plan Note (Signed)
Her infection remains under excellent, long-term control.  She will continue Genvoya and follow-up after lab work in 1 year.  She received her influenza vaccine here today.  I instructed her to go to her local pharmacy and get her Covid booster as soon as possible.
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
## 2020-03-26 IMAGING — MG DIGITAL SCREENING BILAT W/ CAD
4 series · 4 of 4 positions shown · non-contrast
Comparison: Previous exam(s).

CLINICAL DATA: Screening.

EXAM:
DIGITAL SCREENING BILATERAL MAMMOGRAM WITH CAD

[R MLO]
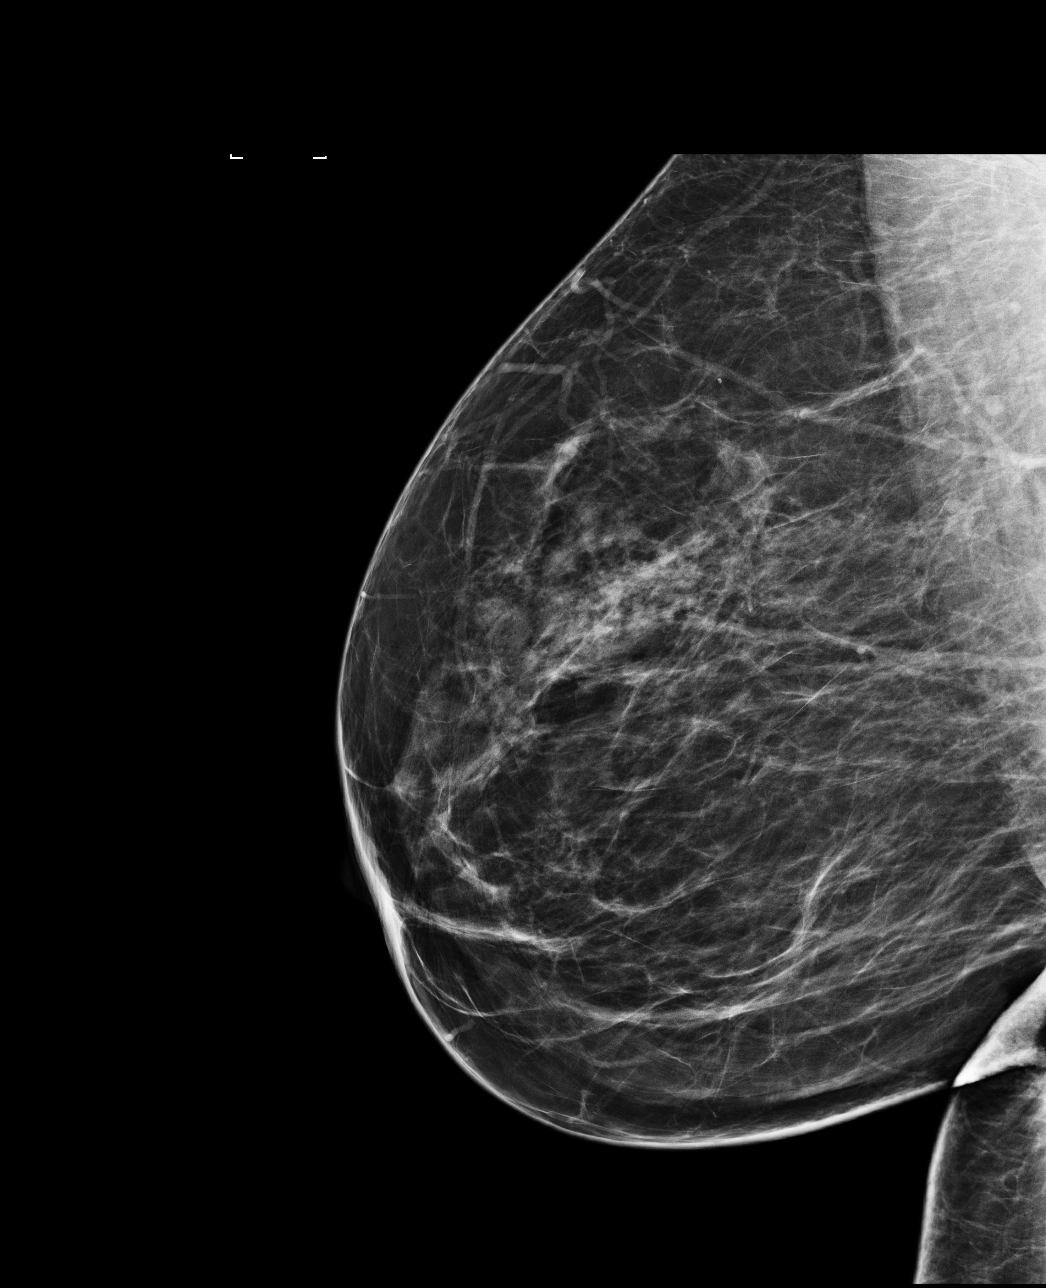

[L CC]
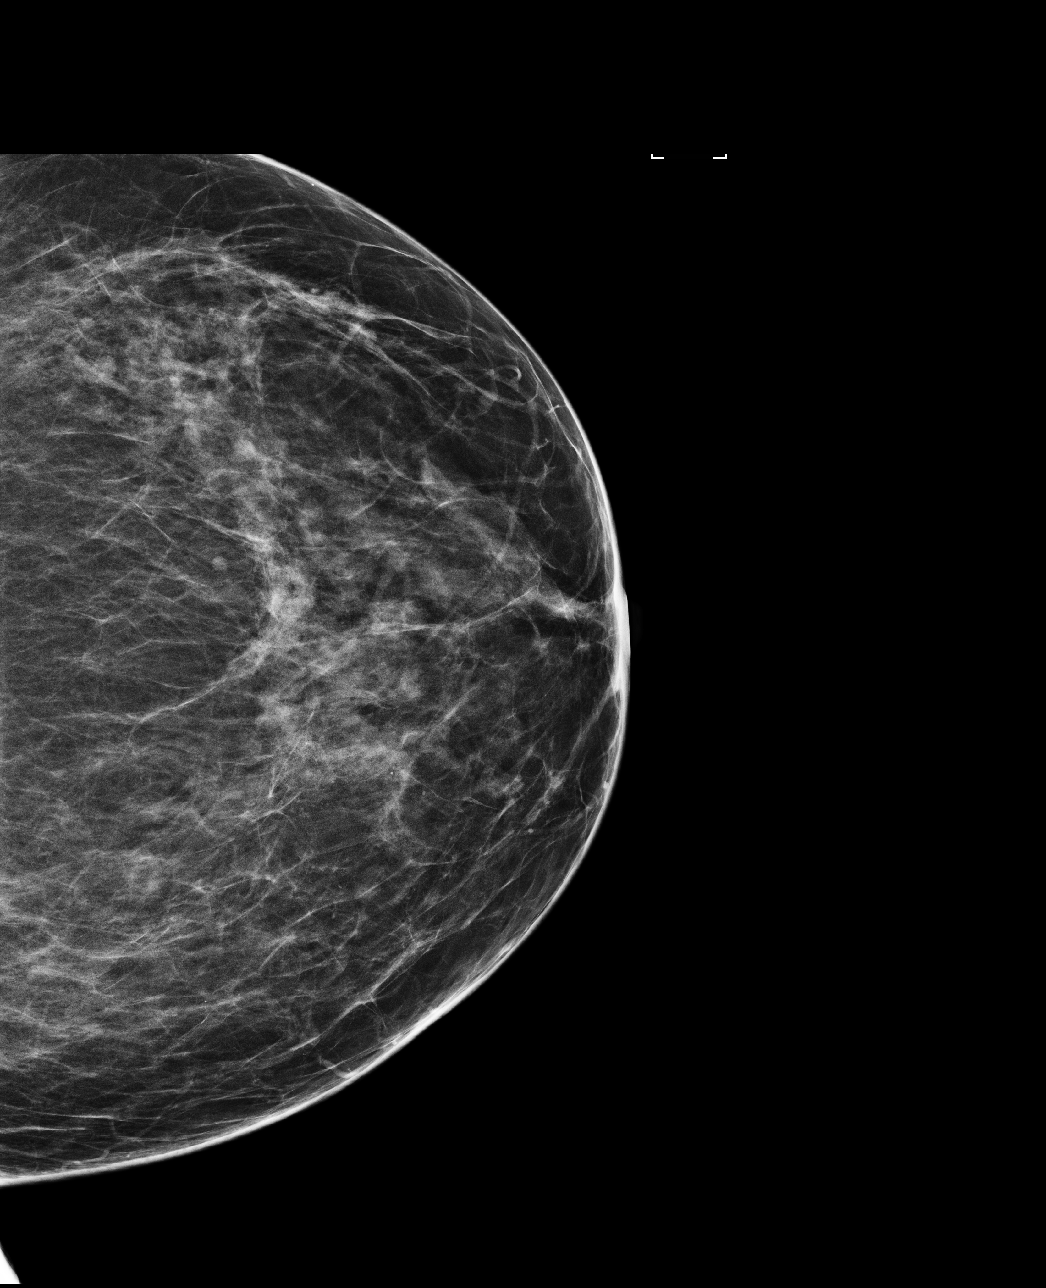

[R CC]
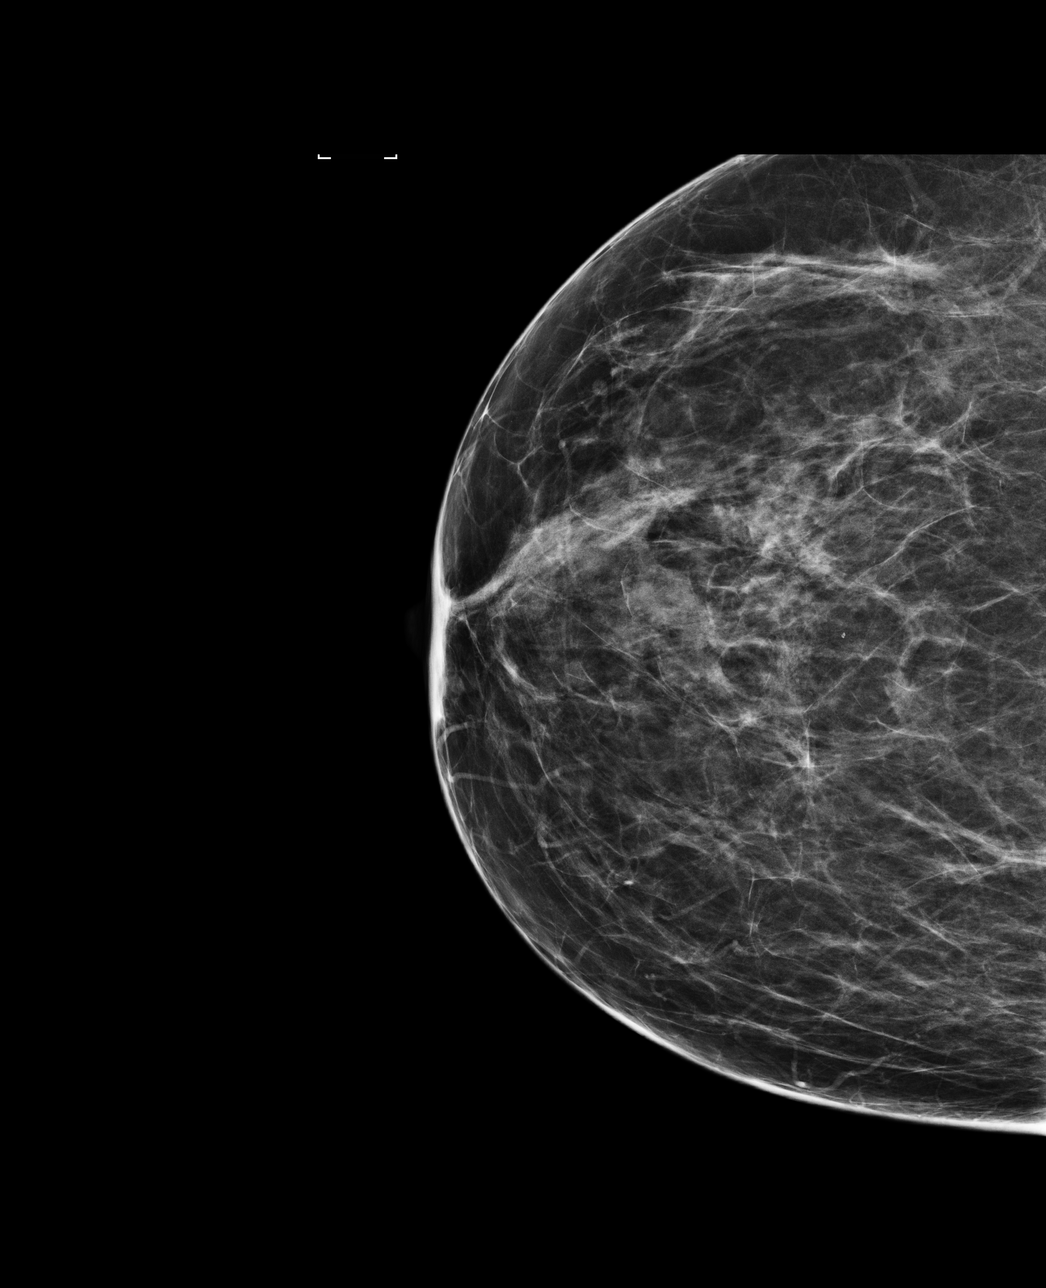

[L MLO]
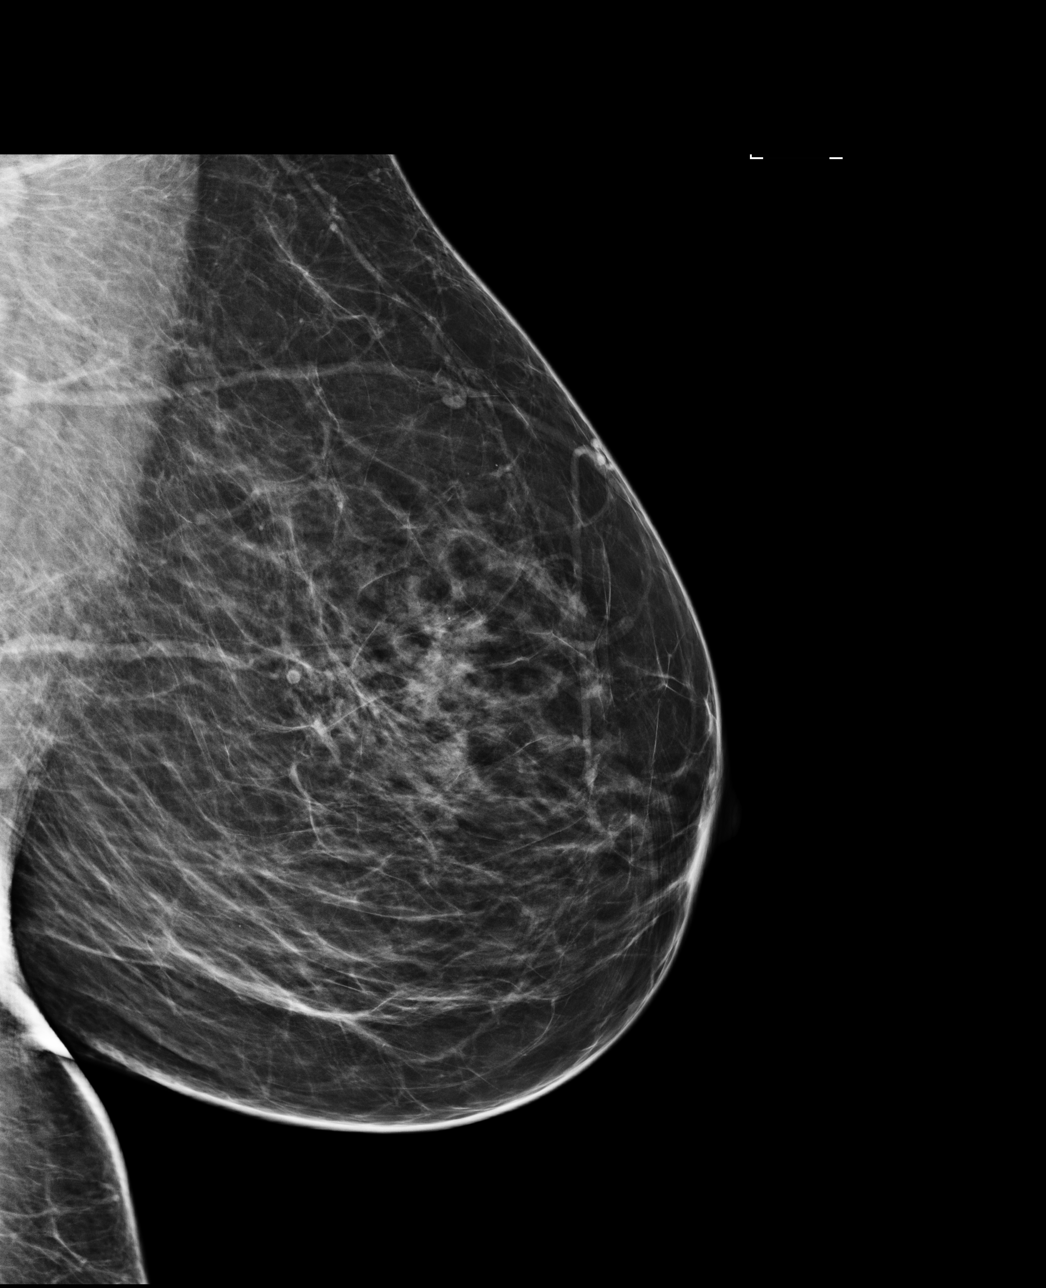

[4 of 4 positions shown; findings below may reference images not displayed]

ACR Breast Density Category b: There are scattered areas of
fibroglandular density.
FINDINGS: There are no findings suspicious for malignancy. Images were
processed with CAD.
IMPRESSION: No mammographic evidence of malignancy. A result letter of this
screening mammogram will be mailed directly to the patient.

RECOMMENDATION:
Screening mammogram in one year. (Code:AS-G-LCT)

BI-RADS CATEGORY  1: Negative.

## 2020-11-15 ENCOUNTER — Other Ambulatory Visit: Payer: Self-pay | Admitting: Family Medicine

## 2020-11-15 DIAGNOSIS — Z1231 Encounter for screening mammogram for malignant neoplasm of breast: Secondary | ICD-10-CM

## 2020-12-16 ENCOUNTER — Other Ambulatory Visit: Payer: PPO

## 2020-12-16 ENCOUNTER — Other Ambulatory Visit: Payer: Self-pay

## 2020-12-16 DIAGNOSIS — B20 Human immunodeficiency virus [HIV] disease: Secondary | ICD-10-CM

## 2020-12-17 LAB — T-HELPER CELL (CD4) - (RCID CLINIC ONLY)
CD4 % Helper T Cell: 35 % (ref 33–65)
CD4 T Cell Abs: 1083 /uL (ref 400–1790)

## 2020-12-19 LAB — CBC
HCT: 38.6 % (ref 35.0–45.0)
Hemoglobin: 12.7 g/dL (ref 11.7–15.5)
MCH: 30 pg (ref 27.0–33.0)
MCHC: 32.9 g/dL (ref 32.0–36.0)
MCV: 91.3 fL (ref 80.0–100.0)
MPV: 9.4 fL (ref 7.5–12.5)
Platelets: 314 10*3/uL (ref 140–400)
RBC: 4.23 10*6/uL (ref 3.80–5.10)
RDW: 12.5 % (ref 11.0–15.0)
WBC: 7.7 10*3/uL (ref 3.8–10.8)

## 2020-12-19 LAB — COMPREHENSIVE METABOLIC PANEL
AG Ratio: 1.2 (calc) (ref 1.0–2.5)
ALT: 13 U/L (ref 6–29)
AST: 13 U/L (ref 10–35)
Albumin: 4.2 g/dL (ref 3.6–5.1)
Alkaline phosphatase (APISO): 69 U/L (ref 37–153)
BUN: 13 mg/dL (ref 7–25)
CO2: 30 mmol/L (ref 20–32)
Calcium: 10.1 mg/dL (ref 8.6–10.4)
Chloride: 101 mmol/L (ref 98–110)
Creat: 0.86 mg/dL (ref 0.50–1.03)
Globulin: 3.5 g/dL (calc) (ref 1.9–3.7)
Glucose, Bld: 115 mg/dL — ABNORMAL HIGH (ref 65–99)
Potassium: 4.3 mmol/L (ref 3.5–5.3)
Sodium: 139 mmol/L (ref 135–146)
Total Bilirubin: 0.4 mg/dL (ref 0.2–1.2)
Total Protein: 7.7 g/dL (ref 6.1–8.1)

## 2020-12-19 LAB — LIPID PANEL
Cholesterol: 224 mg/dL — ABNORMAL HIGH (ref <200)
HDL: 49 mg/dL — ABNORMAL LOW (ref >=50)
LDL Cholesterol (Calc): 153 mg/dL (calc) — ABNORMAL HIGH
Non-HDL Cholesterol (Calc): 175 mg/dL (calc) — ABNORMAL HIGH (ref <130)
Total CHOL/HDL Ratio: 4.6 (calc) (ref <5.0)
Triglycerides: 105 mg/dL (ref <150)

## 2020-12-19 LAB — RPR: RPR Ser Ql: NONREACTIVE

## 2020-12-19 LAB — HIV-1 RNA QUANT-NO REFLEX-BLD
HIV 1 RNA Quant: <20 Copies/mL — ABNORMAL HIGH
HIV-1 RNA Quant, Log: <1.3 Log cps/mL — ABNORMAL HIGH

## 2020-12-21 ENCOUNTER — Inpatient Hospital Stay: Admission: RE | Admit: 2020-12-21 | Payer: PPO | Source: Ambulatory Visit

## 2020-12-21 ENCOUNTER — Ambulatory Visit: Payer: PPO

## 2020-12-24 ENCOUNTER — Ambulatory Visit: Payer: Self-pay

## 2020-12-24 ENCOUNTER — Other Ambulatory Visit: Payer: Self-pay

## 2020-12-24 ENCOUNTER — Ambulatory Visit: Payer: PPO | Admitting: Physician Assistant

## 2020-12-24 DIAGNOSIS — M25511 Pain in right shoulder: Secondary | ICD-10-CM

## 2020-12-24 MED ORDER — LIDOCAINE HCL 1 % IJ SOLN
5.0000 mL | INTRAMUSCULAR | Status: AC | PRN
  Administered 2020-12-24: 5 mL

## 2020-12-24 MED ORDER — METHYLPREDNISOLONE ACETATE 40 MG/ML IJ SUSP
40.0000 mg | INTRAMUSCULAR | Status: AC | PRN
Start: 2020-12-24 — End: 2020-12-24
  Administered 2020-12-24: 40 mg via INTRA_ARTICULAR
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
## 2020-12-30 ENCOUNTER — Encounter: Payer: PPO | Admitting: Internal Medicine

## 2021-01-12 ENCOUNTER — Other Ambulatory Visit: Payer: Self-pay

## 2021-01-12 ENCOUNTER — Ambulatory Visit (INDEPENDENT_AMBULATORY_CARE_PROVIDER_SITE_OTHER): Payer: PPO

## 2021-01-12 ENCOUNTER — Ambulatory Visit (INDEPENDENT_AMBULATORY_CARE_PROVIDER_SITE_OTHER): Payer: PPO | Admitting: Internal Medicine

## 2021-01-12 VITALS — BP 120/80 | HR 60 | Temp 97.5°F | Wt 211.0 lb

## 2021-01-12 DIAGNOSIS — Z23 Encounter for immunization: Secondary | ICD-10-CM | POA: Diagnosis not present

## 2021-01-12 DIAGNOSIS — B20 Human immunodeficiency virus [HIV] disease: Secondary | ICD-10-CM

## 2021-01-12 MED ORDER — GENVOYA 150-150-200-10 MG PO TABS: 1.0000 | ORAL_TABLET | Freq: Every day | ORAL | 11 refills | Status: DC
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
## 2021-01-12 NOTE — Assessment & Plan Note (Signed)
Her infection remains under excellent long-term control.  She will continue Genvoya and follow-up after lab work in 1 year.  She received her annual influenza vaccine and COVID booster vaccine here today.

## 2021-02-22 ENCOUNTER — Ambulatory Visit
Admission: RE | Admit: 2021-02-22 | Discharge: 2021-02-22 | Disposition: A | Discharge status: Home / Self Care | Payer: PPO | Source: Ambulatory Visit | Attending: Family Medicine | Admitting: Family Medicine

## 2021-02-22 DIAGNOSIS — Z1231 Encounter for screening mammogram for malignant neoplasm of breast: Secondary | ICD-10-CM

## 2021-07-01 ENCOUNTER — Ambulatory Visit (HOSPITAL_COMMUNITY)
Admission: EM | Admit: 2021-07-01 | Discharge: 2021-07-01 | Disposition: A | Discharge status: Home / Self Care | Payer: PPO | Attending: Physician Assistant | Admitting: Physician Assistant

## 2021-07-01 ENCOUNTER — Encounter (HOSPITAL_COMMUNITY): Payer: Self-pay

## 2021-07-01 DIAGNOSIS — S30861A Insect bite (nonvenomous) of abdominal wall, initial encounter: Secondary | ICD-10-CM

## 2021-07-01 DIAGNOSIS — W57XXXA Bitten or stung by nonvenomous insect and other nonvenomous arthropods, initial encounter: Secondary | ICD-10-CM | POA: Diagnosis not present

## 2021-07-01 DIAGNOSIS — L299 Pruritus, unspecified: Secondary | ICD-10-CM

## 2021-07-01 MED ORDER — TRIAMCINOLONE ACETONIDE 0.1 % EX CREA
1.0000 | TOPICAL_CREAM | Freq: Two times a day (BID) | CUTANEOUS | 0 refills | Status: DC
Start: 2021-07-01 — End: 2023-04-03

## 2022-02-16 ENCOUNTER — Telehealth: Payer: Self-pay | Admitting: Internal Medicine

## 2022-02-16 ENCOUNTER — Other Ambulatory Visit: Payer: Self-pay | Admitting: Internal Medicine

## 2022-02-16 DIAGNOSIS — B20 Human immunodeficiency virus [HIV] disease: Secondary | ICD-10-CM

## 2022-02-16 NOTE — Telephone Encounter (Signed)
Pt called to schedule a 1 year f/u with Dr. Megan Salon. She is coming in tomorrow for labs and is hoping her prescription can be updated since it has expired. She can be reached at [phone] if needed.

## 2022-02-16 NOTE — Telephone Encounter (Signed)
Pt called to schedule a 1 year f/u with Dr. Megan Salon. She is coming in tomorrow for labs and is hoping her prescription can be updated since it has expired. She can be reached at [phone] if needed.

## 2022-02-16 NOTE — Telephone Encounter (Signed)
Spoke with patient to discuss Genvoya/triamcinolone DDI. Patient reports she is no longer using the triamcinolone cream, counseled her that this interacts with Genvoya. Patient verbalized understanding and has no further questions.   Beryle Flock, RN

## 2022-02-17 ENCOUNTER — Other Ambulatory Visit: Payer: Self-pay

## 2022-02-17 ENCOUNTER — Other Ambulatory Visit: Payer: PPO

## 2022-02-17 DIAGNOSIS — B20 Human immunodeficiency virus [HIV] disease: Secondary | ICD-10-CM

## 2022-02-17 DIAGNOSIS — Z79899 Other long term (current) drug therapy: Secondary | ICD-10-CM

## 2022-02-17 DIAGNOSIS — Z113 Encounter for screening for infections with a predominantly sexual mode of transmission: Secondary | ICD-10-CM

## 2022-02-20 ENCOUNTER — Other Ambulatory Visit: Payer: Self-pay

## 2022-02-20 ENCOUNTER — Other Ambulatory Visit: Payer: 59

## 2022-02-20 DIAGNOSIS — Z79899 Other long term (current) drug therapy: Secondary | ICD-10-CM

## 2022-02-20 DIAGNOSIS — Z113 Encounter for screening for infections with a predominantly sexual mode of transmission: Secondary | ICD-10-CM

## 2022-02-20 DIAGNOSIS — B20 Human immunodeficiency virus [HIV] disease: Secondary | ICD-10-CM

## 2022-02-22 LAB — COMPLETE METABOLIC PANEL WITH GFR
AG Ratio: 1.3 (calc) (ref 1.0–2.5)
ALT: 11 U/L (ref 6–29)
AST: 14 U/L (ref 10–35)
Albumin: 4.5 g/dL (ref 3.6–5.1)
Alkaline phosphatase (APISO): 69 U/L (ref 37–153)
BUN/Creatinine Ratio: 15 (calc) (ref 6–22)
BUN: 16 mg/dL (ref 7–25)
CO2: 27 mmol/L (ref 20–32)
Calcium: 10.6 mg/dL — ABNORMAL HIGH (ref 8.6–10.4)
Chloride: 102 mmol/L (ref 98–110)
Creat: 1.05 mg/dL — ABNORMAL HIGH (ref 0.50–1.03)
Globulin: 3.5 g/dL (calc) (ref 1.9–3.7)
Glucose, Bld: 94 mg/dL (ref 65–99)
Potassium: 4.2 mmol/L (ref 3.5–5.3)
Sodium: 140 mmol/L (ref 135–146)
Total Bilirubin: 0.3 mg/dL (ref 0.2–1.2)
Total Protein: 8 g/dL (ref 6.1–8.1)
eGFR: 62 mL/min/{1.73_m2} (ref >=60)

## 2022-02-22 LAB — T-HELPER CELLS (CD4) COUNT (NOT AT ARMC)
Absolute CD4: 1124 cells/uL (ref 490–1740)
CD4 T Helper %: 33 % (ref 30–61)
Total lymphocyte count: 3435 cells/uL (ref 850–3900)

## 2022-02-22 LAB — CBC WITH DIFFERENTIAL/PLATELET
Absolute Monocytes: 231 cells/uL (ref 200–950)
Basophils Absolute: 22 cells/uL (ref 0–200)
Basophils Relative: 0.4 %
Eosinophils Absolute: 28 cells/uL (ref 15–500)
Eosinophils Relative: 0.5 %
HCT: 37.9 % (ref 35.0–45.0)
Hemoglobin: 12.6 g/dL (ref 11.7–15.5)
Lymphs Abs: 3141 cells/uL (ref 850–3900)
MCH: 29.6 pg (ref 27.0–33.0)
MCHC: 33.2 g/dL (ref 32.0–36.0)
MCV: 89 fL (ref 80.0–100.0)
MPV: 10 fL (ref 7.5–12.5)
Monocytes Relative: 4.2 %
Neutro Abs: 2079 cells/uL (ref 1500–7800)
Neutrophils Relative %: 37.8 %
Platelets: 280 10*3/uL (ref 140–400)
RBC: 4.26 10*6/uL (ref 3.80–5.10)
RDW: 13 % (ref 11.0–15.0)
Total Lymphocyte: 57.1 %
WBC: 5.5 10*3/uL (ref 3.8–10.8)

## 2022-02-22 LAB — LIPID PANEL
Cholesterol: 216 mg/dL — ABNORMAL HIGH (ref <200)
HDL: 62 mg/dL (ref >=50)
LDL Cholesterol (Calc): 136 mg/dL (calc) — ABNORMAL HIGH
Non-HDL Cholesterol (Calc): 154 mg/dL (calc) — ABNORMAL HIGH (ref <130)
Total CHOL/HDL Ratio: 3.5 (calc) (ref <5.0)
Triglycerides: 85 mg/dL (ref <150)

## 2022-02-22 LAB — HIV-1 RNA QUANT-NO REFLEX-BLD
HIV 1 RNA Quant: NOT DETECTED Copies/mL
HIV-1 RNA Quant, Log: NOT DETECTED Log cps/mL

## 2022-02-22 LAB — RPR: RPR Ser Ql: NONREACTIVE

## 2022-03-01 ENCOUNTER — Other Ambulatory Visit: Payer: Self-pay

## 2022-03-01 ENCOUNTER — Ambulatory Visit: Payer: 59 | Admitting: Internal Medicine

## 2022-03-01 ENCOUNTER — Ambulatory Visit (INDEPENDENT_AMBULATORY_CARE_PROVIDER_SITE_OTHER): Payer: 59

## 2022-03-01 VITALS — BP 122/71 | HR 70 | Temp 98.2°F | Wt 210.0 lb

## 2022-03-01 DIAGNOSIS — B20 Human immunodeficiency virus [HIV] disease: Secondary | ICD-10-CM | POA: Diagnosis not present

## 2022-03-01 DIAGNOSIS — Z23 Encounter for immunization: Secondary | ICD-10-CM

## 2022-03-01 MED ORDER — GENVOYA 150-150-200-10 MG PO TABS: 1.0000 | ORAL_TABLET | Freq: Every day | ORAL | 0 refills | Status: DC

## 2022-03-01 NOTE — Assessment & Plan Note (Signed)
Her infection remains under excellent, long-term control.  She will continue Genvoya and follow-up after lab work in 1 year.  She received an influenza vaccine and an updated COVID-vaccine here today.
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
## 2022-04-03 ENCOUNTER — Ambulatory Visit: Payer: 59 | Admitting: Orthopaedic Surgery

## 2022-04-10 ENCOUNTER — Ambulatory Visit (INDEPENDENT_AMBULATORY_CARE_PROVIDER_SITE_OTHER): Payer: 59 | Admitting: Orthopaedic Surgery

## 2022-04-10 ENCOUNTER — Encounter: Payer: Self-pay | Admitting: Orthopaedic Surgery

## 2022-04-10 DIAGNOSIS — G5601 Carpal tunnel syndrome, right upper limb: Secondary | ICD-10-CM

## 2022-04-10 NOTE — Progress Notes (Signed)
The patient is someone we have seen in the past.  She has a diagnosis of moderate carpal tunnel syndrome of both her upper extremities and this was confirmed with EMG/nerve conduction studies ordered by her PCP.  Several years ago we performed a left open carpal tunnel release and she said that is done great.  She is having numbness and tingling still in her right hand and would like to proceed with that surgery.  She has had no acute change in her medical status.  She is otherwise healthy individual.  Her HIV is under excellent control.  She is not a smoker.  She does work with her hands on a regular basis.  She has excellent grip and pinch strength bilaterally and is quite strong.  Her left hand healed up nicely with no muscle atrophy.  The right hand has no muscle atrophy but continues to have numbness in the median nerve distribution with positive Phalen's and Tinel's exam.  We will work on getting her scheduled for right open carpal tunnel release.  Having had this before she is fully aware of the risk and benefits of surgery and what to expect.  She would like to have a carpal tunnel splint in the interim and I agree with this.  Will work on scheduling for surgery and then we will see her back in 2 weeks postoperative.  All questions and concerns were addressed and answered.

## 2022-05-25 ENCOUNTER — Other Ambulatory Visit: Payer: Self-pay | Admitting: Orthopaedic Surgery

## 2022-05-25 MED ORDER — HYDROCODONE-ACETAMINOPHEN 5-325 MG PO TABS: 1.0000 | ORAL_TABLET | Freq: Four times a day (QID) | ORAL | 0 refills | Status: DC | PRN

## 2022-06-08 ENCOUNTER — Ambulatory Visit (INDEPENDENT_AMBULATORY_CARE_PROVIDER_SITE_OTHER): Payer: 59 | Admitting: Orthopaedic Surgery

## 2022-06-08 ENCOUNTER — Encounter: Payer: Self-pay | Admitting: Orthopaedic Surgery

## 2022-06-08 ENCOUNTER — Other Ambulatory Visit: Payer: Self-pay

## 2022-06-08 DIAGNOSIS — Z9889 Other specified postprocedural states: Secondary | ICD-10-CM

## 2022-06-08 DIAGNOSIS — B20 Human immunodeficiency virus [HIV] disease: Secondary | ICD-10-CM

## 2022-06-08 MED ORDER — GENVOYA 150-150-200-10 MG PO TABS: 1.0000 | ORAL_TABLET | Freq: Every day | ORAL | 10 refills | Status: DC

## 2022-06-08 NOTE — Progress Notes (Signed)
The patient is 2 weeks status post a right open carpal tunnel release.  We have released her left carpal tunnel in the past.  She is doing well overall.  On exam I did remove the sutures in place Steri-Strips.  She is moving her thumb and fingers easily with minimal swelling.  I did place a dry dressing around her wrist that I want her to keep on her hand and wrist for the next 3 days and then she can remove this on Monday and get the incision wet daily.  If the wound does open up some she can clean it daily and place Vaseline on the wound and new dressing.  Will see her back in 6 weeks to see how she is doing overall.

## 2022-07-19 ENCOUNTER — Encounter: Payer: 59 | Admitting: Orthopaedic Surgery

## 2022-07-31 ENCOUNTER — Ambulatory Visit (INDEPENDENT_AMBULATORY_CARE_PROVIDER_SITE_OTHER): Payer: 59 | Admitting: Physician Assistant

## 2022-07-31 ENCOUNTER — Encounter: Payer: Self-pay | Admitting: Physician Assistant

## 2022-07-31 DIAGNOSIS — G5601 Carpal tunnel syndrome, right upper limb: Secondary | ICD-10-CM

## 2022-07-31 NOTE — Progress Notes (Signed)
HPI: [name] returns today 6 weeks status post right carpal tunnel release.  She states she is doing well.  Denies any numbness tingling in the hand.  She feels her surgical incisions are healing well.  She does occasionally take some Motrin.   Physical exam: Right hand surgical incisions well-healed no signs of infection or dehiscence.  Full sensation light touch throughout the hand.  Full motor.  Impression status post right carpal tunnel release  Plan: Continue work on scar tissue mobilization.  Follow-up with [place] as needed.  Questions were encouraged and answered at length.

## 2022-09-06 ENCOUNTER — Other Ambulatory Visit: Payer: Self-pay | Admitting: Family Medicine

## 2022-09-06 DIAGNOSIS — Z1231 Encounter for screening mammogram for malignant neoplasm of breast: Secondary | ICD-10-CM

## 2022-09-07 ENCOUNTER — Ambulatory Visit
Admission: RE | Admit: 2022-09-07 | Discharge: 2022-09-07 | Disposition: A | Discharge status: Home / Self Care | Payer: 59 | Source: Ambulatory Visit | Attending: Family Medicine | Admitting: Family Medicine

## 2022-09-07 DIAGNOSIS — Z1231 Encounter for screening mammogram for malignant neoplasm of breast: Secondary | ICD-10-CM

## 2023-02-13 ENCOUNTER — Other Ambulatory Visit: Payer: Self-pay

## 2023-02-13 ENCOUNTER — Other Ambulatory Visit: Payer: 59

## 2023-02-13 DIAGNOSIS — B20 Human immunodeficiency virus [HIV] disease: Secondary | ICD-10-CM

## 2023-02-13 DIAGNOSIS — Z113 Encounter for screening for infections with a predominantly sexual mode of transmission: Secondary | ICD-10-CM

## 2023-02-15 ENCOUNTER — Other Ambulatory Visit: Payer: 59

## 2023-02-27 ENCOUNTER — Ambulatory Visit: Payer: 59 | Admitting: Internal Medicine

## 2023-03-15 ENCOUNTER — Other Ambulatory Visit: Payer: Self-pay

## 2023-03-15 ENCOUNTER — Other Ambulatory Visit: Payer: 59

## 2023-03-15 ENCOUNTER — Other Ambulatory Visit (HOSPITAL_COMMUNITY)
Admission: RE | Admit: 2023-03-15 | Discharge: 2023-03-15 | Disposition: A | Discharge status: Home / Self Care | Source: Ambulatory Visit | Attending: Internal Medicine | Admitting: Internal Medicine

## 2023-03-15 DIAGNOSIS — B20 Human immunodeficiency virus [HIV] disease: Secondary | ICD-10-CM | POA: Insufficient documentation

## 2023-03-15 DIAGNOSIS — Z113 Encounter for screening for infections with a predominantly sexual mode of transmission: Secondary | ICD-10-CM

## 2023-03-16 LAB — URINE CYTOLOGY ANCILLARY ONLY
Chlamydia: NEGATIVE
Comment: NEGATIVE
Comment: NORMAL
Neisseria Gonorrhea: NEGATIVE

## 2023-03-16 LAB — T-HELPER CELL (CD4) - (RCID CLINIC ONLY)
CD4 % Helper T Cell: 33 % (ref 33–65)
CD4 T Cell Abs: 909 /uL (ref 400–1790)

## 2023-03-19 LAB — COMPLETE METABOLIC PANEL WITH GFR
AG Ratio: 1.6 (calc) (ref 1.0–2.5)
ALT: 13 U/L (ref 6–29)
AST: 15 U/L (ref 10–35)
Albumin: 4.4 g/dL (ref 3.6–5.1)
Alkaline phosphatase (APISO): 64 U/L (ref 37–153)
BUN: 14 mg/dL (ref 7–25)
CO2: 29 mmol/L (ref 20–32)
Calcium: 9.6 mg/dL (ref 8.6–10.4)
Chloride: 104 mmol/L (ref 98–110)
Creat: 0.87 mg/dL (ref 0.50–1.03)
Globulin: 2.8 g/dL (ref 1.9–3.7)
Glucose, Bld: 99 mg/dL (ref 65–99)
Potassium: 4.1 mmol/L (ref 3.5–5.3)
Sodium: 139 mmol/L (ref 135–146)
Total Bilirubin: 0.4 mg/dL (ref 0.2–1.2)
Total Protein: 7.2 g/dL (ref 6.1–8.1)
eGFR: 78 mL/min/{1.73_m2} (ref >=60)

## 2023-03-19 LAB — CBC WITH DIFFERENTIAL/PLATELET
Absolute Lymphocytes: 2766 {cells}/uL (ref 850–3900)
Absolute Monocytes: 386 {cells}/uL (ref 200–950)
Basophils Absolute: 22 {cells}/uL (ref 0–200)
Basophils Relative: 0.4 %
Eosinophils Absolute: 28 {cells}/uL (ref 15–500)
Eosinophils Relative: 0.5 %
HCT: 37 % (ref 35.0–45.0)
Hemoglobin: 11.8 g/dL (ref 11.7–15.5)
MCH: 29.4 pg (ref 27.0–33.0)
MCHC: 31.9 g/dL — ABNORMAL LOW (ref 32.0–36.0)
MCV: 92 fL (ref 80.0–100.0)
MPV: 9.9 fL (ref 7.5–12.5)
Monocytes Relative: 6.9 %
Neutro Abs: 2397 {cells}/uL (ref 1500–7800)
Neutrophils Relative %: 42.8 %
Platelets: 297 10*3/uL (ref 140–400)
RBC: 4.02 10*6/uL (ref 3.80–5.10)
RDW: 13 % (ref 11.0–15.0)
Total Lymphocyte: 49.4 %
WBC: 5.6 10*3/uL (ref 3.8–10.8)

## 2023-03-19 LAB — LIPID PANEL
Cholesterol: 193 mg/dL (ref <200)
HDL: 60 mg/dL (ref >=50)
LDL Cholesterol (Calc): 115 mg/dL — ABNORMAL HIGH
Non-HDL Cholesterol (Calc): 133 mg/dL — ABNORMAL HIGH (ref <130)
Total CHOL/HDL Ratio: 3.2 (calc) (ref <5.0)
Triglycerides: 82 mg/dL (ref <150)

## 2023-03-19 LAB — RPR: RPR Ser Ql: NONREACTIVE

## 2023-03-19 LAB — HIV-1 RNA QUANT-NO REFLEX-BLD
HIV 1 RNA Quant: NOT DETECTED {copies}/mL
HIV-1 RNA Quant, Log: NOT DETECTED {Log_copies}/mL

## 2023-04-02 ENCOUNTER — Other Ambulatory Visit: Payer: Self-pay

## 2023-04-02 ENCOUNTER — Ambulatory Visit: Payer: Self-pay | Admitting: Internal Medicine

## 2023-04-02 ENCOUNTER — Encounter: Payer: Self-pay | Admitting: Internal Medicine

## 2023-04-02 VITALS — BP 121/68 | HR 55 | Temp 97.7°F | Resp 16 | Wt 211.2 lb

## 2023-04-02 DIAGNOSIS — Z79899 Other long term (current) drug therapy: Secondary | ICD-10-CM

## 2023-04-02 DIAGNOSIS — Z113 Encounter for screening for infections with a predominantly sexual mode of transmission: Secondary | ICD-10-CM

## 2023-04-02 DIAGNOSIS — Z23 Encounter for immunization: Secondary | ICD-10-CM | POA: Diagnosis not present

## 2023-04-02 DIAGNOSIS — B20 Human immunodeficiency virus [HIV] disease: Secondary | ICD-10-CM | POA: Diagnosis not present

## 2023-04-02 NOTE — Assessment & Plan Note (Signed)
 Lipid panel done and reviewed with her.

## 2023-04-02 NOTE — Assessment & Plan Note (Signed)
 She was screened and no concerns.

## 2023-04-02 NOTE — Progress Notes (Signed)
   Subjective:    Patient ID: Anne Joyce, female    DOB: Feb 23, 1965, 58 y.o.   MRN: [id number]  HPI Anne Joyce is here for follow-up of HIV. She continues on Genvoya and denies any missed doses.  She had labs prior to the visit and CD4 count 909 and she has a continued undetectable viral load.  She has no complaints today.  She is attempting to lose weight and has struggled with that.  She does exercise and has a Systems analyst.  She is seeing the nutritionist tomorrow.  She has had no issues with getting or taking her medication.   Review of Systems  Constitutional:  Negative for fatigue.  Gastrointestinal:  Negative for diarrhea and nausea.  Skin:  Negative for rash.       Objective:   Physical Exam Eyes:     General: No scleral icterus. Pulmonary:     Effort: Pulmonary effort is normal.  Neurological:     Mental Status: She is alert.   SH: No tobacco        Assessment & Plan:

## 2023-04-02 NOTE — Assessment & Plan Note (Signed)
 She continues to do well on Genvoya and no changes indicated.  Reviewed the labs with her and she is pleased with the ongoing good results.  No missed doses good compliance so she can return in about 11 months for routine follow-up.

## 2023-04-02 NOTE — Assessment & Plan Note (Signed)
 I discussed the vaccines including the tetanus shot and recent Prevnar vaccine and both given today.

## 2023-04-03 ENCOUNTER — Encounter (INDEPENDENT_AMBULATORY_CARE_PROVIDER_SITE_OTHER): Payer: Self-pay | Admitting: Internal Medicine

## 2023-04-03 ENCOUNTER — Ambulatory Visit (INDEPENDENT_AMBULATORY_CARE_PROVIDER_SITE_OTHER): Admitting: Internal Medicine

## 2023-04-03 VITALS — BP 134/72 | HR 60 | Temp 98.1°F | Ht 64.0 in | Wt 210.0 lb

## 2023-04-03 DIAGNOSIS — E66812 Obesity, class 2: Secondary | ICD-10-CM | POA: Diagnosis not present

## 2023-04-03 DIAGNOSIS — Z6836 Body mass index (BMI) 36.0-36.9, adult: Secondary | ICD-10-CM

## 2023-04-03 DIAGNOSIS — E785 Hyperlipidemia, unspecified: Secondary | ICD-10-CM | POA: Diagnosis not present

## 2023-04-03 DIAGNOSIS — R7303 Prediabetes: Secondary | ICD-10-CM

## 2023-04-03 DIAGNOSIS — B20 Human immunodeficiency virus [HIV] disease: Secondary | ICD-10-CM | POA: Diagnosis not present

## 2023-04-03 NOTE — Assessment & Plan Note (Signed)
 Most recent A1c is 6.3 found in [facility] in 2023.  Patient aware of disease state and risk of progression. This may contribute to abnormal cravings, fatigue and diabetic complications without having diabetes.   This may be compounded by antiretroviral therapy  We have discussed treatment options which include: losing 7 to 10% of body weight, increasing physical activity to a goal of 150 minutes a week at moderate intensity.  She will be screened with a fasting blood glucose, hemoglobin A1c and insulin levels at intake appointment  She may also be a candidate for pharmacoprophylaxis with metformin or incretin mimetic.

## 2023-04-03 NOTE — Assessment & Plan Note (Signed)
 LDL is at goal. Elevated LDL may be secondary to nutrition, genetics and spillover effect from excess adiposity. Recommended LDL goal is <70 to reduce the risk of fatty streaks and the progression to obstructive ASCVD in the future.   Her 10 year risk is: The 10-year ASCVD risk score (Arnett DK, et al., 2019) is: 4.1%  Lab Results  Component Value Date   CHOL 193 03/15/2023   HDL 60 03/15/2023   LDLCALC 115 (H) 03/15/2023   TRIG 82 03/15/2023   CHOLHDL 3.2 03/15/2023    Continue weight loss therapy, losing 10% or more of body weight may improve condition. Also advised to reduce saturated fats in diet to less than 10% of daily calories.

## 2023-04-03 NOTE — Assessment & Plan Note (Signed)
 Patient reports she started to gain weight after her pregnancies she is also menopausal and is on antiretroviral medication.  She also has family history of obesity so there is a genetic component.  We reviewed anthropometrics, biometrics, associated medical conditions and contributing factors with patient. she would benefit from a medically tailored reduced calorie nutrional plan based on her REE (resting energy expenditure), which will be determined by indirect calorimetry.  We will also assess for cardiometabolic risk and nutritional derangements via fasting labs at intake appointment.
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
## 2023-04-03 NOTE — Assessment & Plan Note (Signed)
 Reviewed most recent labs she has an undetectable viral load and stable CD4 count.  She is currently on antiretroviral therapy which may contribute to weight gain and metabolic derangements.  She will continue current regimen and follow-up with HIV specialist as scheduled.

## 2023-05-29 NOTE — Progress Notes (Signed)
 The 10-year ASCVD risk score (Arnett DK, et al., 2019) is: 4.1%   Values used to calculate the score:     Age: 58 years     Sex: Female     Is Non-Hispanic African American: Yes     Diabetic: No     Tobacco smoker: No     Systolic Blood Pressure: 134 mmHg     Is BP treated: No     HDL Cholesterol: 60 mg/dL     Total Cholesterol: 193 mg/dL  ASCVD < 5%.  Anne Joyce, BSN, RN

## 2023-05-30 ENCOUNTER — Telehealth: Payer: Self-pay | Admitting: Orthopaedic Surgery

## 2023-05-30 NOTE — Telephone Encounter (Signed)
 Patient called. Would like gabapentin  called in for her.

## 2023-06-18 DIAGNOSIS — Z0289 Encounter for other administrative examinations: Secondary | ICD-10-CM

## 2023-07-25 ENCOUNTER — Ambulatory Visit (INDEPENDENT_AMBULATORY_CARE_PROVIDER_SITE_OTHER): Admitting: Internal Medicine

## 2023-07-25 ENCOUNTER — Encounter (INDEPENDENT_AMBULATORY_CARE_PROVIDER_SITE_OTHER): Payer: Self-pay | Admitting: Internal Medicine

## 2023-07-25 VITALS — BP 129/89 | HR 61 | Temp 98.0°F | Ht 64.0 in | Wt 215.0 lb

## 2023-07-25 DIAGNOSIS — R0602 Shortness of breath: Secondary | ICD-10-CM

## 2023-07-25 DIAGNOSIS — Z1331 Encounter for screening for depression: Secondary | ICD-10-CM | POA: Diagnosis not present

## 2023-07-25 DIAGNOSIS — B20 Human immunodeficiency virus [HIV] disease: Secondary | ICD-10-CM

## 2023-07-25 DIAGNOSIS — R5383 Other fatigue: Secondary | ICD-10-CM | POA: Diagnosis not present

## 2023-07-25 DIAGNOSIS — E785 Hyperlipidemia, unspecified: Secondary | ICD-10-CM | POA: Diagnosis not present

## 2023-07-25 DIAGNOSIS — R948 Abnormal results of function studies of other organs and systems: Secondary | ICD-10-CM | POA: Insufficient documentation

## 2023-07-25 DIAGNOSIS — R7303 Prediabetes: Secondary | ICD-10-CM

## 2023-07-25 DIAGNOSIS — E66812 Obesity, class 2: Secondary | ICD-10-CM

## 2023-07-25 DIAGNOSIS — Z6836 Body mass index (BMI) 36.0-36.9, adult: Secondary | ICD-10-CM

## 2023-07-25 NOTE — Assessment & Plan Note (Signed)
 Some contributing factors:family history of obesity, disruption of circadian rhythm / sleep disordered breathing, chronic skipping of meals, menopause, and multiple weight loss attempts in the past  [place] has lost about 12 to 13% of total body weight she is likely experiencing metabolic adaptations associated with this this is the reason why her resting energy expenditure is lower than her basal metabolic rate.  We reviewed strategies to improve metabolic rate I therefore do not recommend a significant calorie deficit she will be started on a 1200-calorie low-carb moderate protein meal plan.  She will also continue to work on increasing volume of physical activity.
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
## 2023-07-25 NOTE — Assessment & Plan Note (Signed)
 On HAART, with sustained virological response and normal CD4 count.  Patient counseled on HIV being a chronic inflammatory state and how to increase cardiovascular risk.  I reviewed HIV regimen for the most part not reported to cause weight gain.  Continue current regimen follow-up with HIV specialist

## 2023-07-25 NOTE — Assessment & Plan Note (Addendum)
 LDL is not at goal. Elevated LDL may be secondary to nutrition, genetics and spillover effect from excess adiposity. Recommended LDL goal is <70 to reduce the risk of fatty streaks and the progression to obstructive ASCVD in the future.   Her 10 year risk is: The 10-year ASCVD risk score (Arnett DK, et al., 2019) is: 3.6%.  Considering that she has chronic HIV her cardiovascular risk is elevated.  Lab Results  Component Value Date   CHOL 193 03/15/2023   HDL 60 03/15/2023   LDLCALC 115 (H) 03/15/2023   TRIG 82 03/15/2023   CHOLHDL 3.2 03/15/2023    Continue weight loss therapy, losing 10% or more of body weight may improve condition. Also advised to reduce saturated fats in diet to less than 10% of daily calories.  Consider statin therapy for cardiovascular risk reduction considering chronic inflammatory state.

## 2023-07-25 NOTE — Assessment & Plan Note (Addendum)
 Most recent A1c is No results found for: HGBA1C  Patient aware of disease state and risk of progression. This may contribute to abnormal cravings, fatigue and diabetic complications without having diabetes.   We have discussed treatment options which include: losing 7 to 10% of body weight, increasing physical activity to a goal of 150 minutes a week at moderate intensity.  Advised to maintain a diet low on simple and processed carbohydrates.  She may also be a candidate for pharmacoprophylaxis with metformin or incretin mimetic.   Check hemoglobin A1c, fasting blood glucose and insulin  levels

## 2023-07-25 NOTE — Assessment & Plan Note (Signed)
 Patient has a slower than predicted metabolism. IC 1339 vs. calculated 1642. This may contribute to weight gain, chronic fatigue and difficulty losing weight.   We reviewed measures to improve metabolism including not skipping meals, progressive strengthening exercises, increasing protein intake at every meal and maintaining adequate hydration and sleep.

## 2023-07-26 ENCOUNTER — Ambulatory Visit (INDEPENDENT_AMBULATORY_CARE_PROVIDER_SITE_OTHER): Payer: Self-pay | Admitting: Nurse Practitioner

## 2023-07-26 LAB — COMPREHENSIVE METABOLIC PANEL WITH GFR
ALT: 12 IU/L (ref 0–32)
AST: 12 IU/L (ref 0–40)
Albumin: 4.3 g/dL (ref 3.8–4.9)
Alkaline Phosphatase: 83 IU/L (ref 44–121)
BUN/Creatinine Ratio: 16 (ref 9–23)
BUN: 13 mg/dL (ref 6–24)
Bilirubin Total: 0.3 mg/dL (ref 0.0–1.2)
CO2: 23 mmol/L (ref 20–29)
Calcium: 9.7 mg/dL (ref 8.7–10.2)
Chloride: 104 mmol/L (ref 96–106)
Creatinine, Ser: 0.8 mg/dL (ref 0.57–1.00)
Globulin, Total: 2.9 g/dL (ref 1.5–4.5)
Glucose: 101 mg/dL — ABNORMAL HIGH (ref 70–99)
Potassium: 4.4 mmol/L (ref 3.5–5.2)
Sodium: 141 mmol/L (ref 134–144)
Total Protein: 7.2 g/dL (ref 6.0–8.5)
eGFR: 86 mL/min/1.73 (ref >59)

## 2023-07-26 LAB — VITAMIN D 25 HYDROXY (VIT D DEFICIENCY, FRACTURES): Vit D, 25-Hydroxy: 47.9 ng/mL (ref 30.0–100.0)

## 2023-07-26 LAB — TSH: TSH: 2.95 u[IU]/mL (ref 0.450–4.500)

## 2023-07-26 LAB — INSULIN, RANDOM: INSULIN: 15.1 u[IU]/mL (ref 2.6–24.9)

## 2023-07-26 LAB — HEMOGLOBIN A1C
Est. average glucose Bld gHb Est-mCnc: 126 mg/dL
Hgb A1c MFr Bld: 6 % — ABNORMAL HIGH (ref 4.8–5.6)

## 2023-07-26 LAB — VITAMIN B12: Vitamin B-12: >2000 pg/mL — ABNORMAL HIGH (ref 232–1245)

## 2023-07-30 ENCOUNTER — Other Ambulatory Visit: Payer: Self-pay

## 2023-07-30 DIAGNOSIS — B20 Human immunodeficiency virus [HIV] disease: Secondary | ICD-10-CM

## 2023-07-30 MED ORDER — GENVOYA 150-150-200-10 MG PO TABS: 1.0000 | ORAL_TABLET | Freq: Every day | ORAL | 10 refills | Status: AC

## 2023-08-07 ENCOUNTER — Ambulatory Visit (INDEPENDENT_AMBULATORY_CARE_PROVIDER_SITE_OTHER): Admitting: Internal Medicine

## 2023-08-10 ENCOUNTER — Other Ambulatory Visit: Payer: Self-pay | Admitting: Family

## 2023-08-10 DIAGNOSIS — Z1231 Encounter for screening mammogram for malignant neoplasm of breast: Secondary | ICD-10-CM

## 2023-08-20 ENCOUNTER — Ambulatory Visit (INDEPENDENT_AMBULATORY_CARE_PROVIDER_SITE_OTHER): Admitting: Internal Medicine

## 2023-08-22 ENCOUNTER — Ambulatory Visit (INDEPENDENT_AMBULATORY_CARE_PROVIDER_SITE_OTHER): Admitting: Family Medicine

## 2023-08-30 ENCOUNTER — Encounter (INDEPENDENT_AMBULATORY_CARE_PROVIDER_SITE_OTHER): Payer: Self-pay | Admitting: Internal Medicine

## 2023-08-30 ENCOUNTER — Ambulatory Visit (INDEPENDENT_AMBULATORY_CARE_PROVIDER_SITE_OTHER): Admitting: Internal Medicine

## 2023-08-30 VITALS — BP 136/86 | HR 59 | Temp 97.4°F | Ht 64.0 in | Wt 212.0 lb

## 2023-08-30 DIAGNOSIS — E785 Hyperlipidemia, unspecified: Secondary | ICD-10-CM | POA: Diagnosis not present

## 2023-08-30 DIAGNOSIS — R948 Abnormal results of function studies of other organs and systems: Secondary | ICD-10-CM | POA: Diagnosis not present

## 2023-08-30 DIAGNOSIS — R030 Elevated blood-pressure reading, without diagnosis of hypertension: Secondary | ICD-10-CM | POA: Insufficient documentation

## 2023-08-30 DIAGNOSIS — B20 Human immunodeficiency virus [HIV] disease: Secondary | ICD-10-CM | POA: Diagnosis not present

## 2023-08-30 DIAGNOSIS — Z6836 Body mass index (BMI) 36.0-36.9, adult: Secondary | ICD-10-CM

## 2023-08-30 DIAGNOSIS — R7303 Prediabetes: Secondary | ICD-10-CM | POA: Diagnosis not present

## 2023-08-30 DIAGNOSIS — E66812 Obesity, class 2: Secondary | ICD-10-CM

## 2023-08-30 MED ORDER — METFORMIN HCL ER 500 MG PO TB24: 500.0000 mg | ORAL_TABLET | Freq: Every day | ORAL | 0 refills | Status: DC

## 2023-08-30 NOTE — Assessment & Plan Note (Signed)
 On HAART, with sustained virological response and normal CD4 count.  Patient counseled on HIV being a chronic inflammatory state and how to increase cardiovascular risk.  I reviewed HIV regimen for the most part not reported to cause weight gain.  Continue current regimen follow-up with HIV specialist

## 2023-08-30 NOTE — Assessment & Plan Note (Signed)
 LDL is not at goal. Elevated LDL may be secondary to nutrition, genetics and spillover effect from excess adiposity. Recommended LDL goal is <70 to reduce the risk of fatty streaks and the progression to obstructive ASCVD in the future.   Her 10 year risk is: The 10-year ASCVD risk score (Arnett DK, et al., 2019) is: 4.5%.  Considering that she has chronic HIV her cardiovascular risk is elevated.  Lab Results  Component Value Date   CHOL 193 03/15/2023   HDL 60 03/15/2023   LDLCALC 115 (H) 03/15/2023   TRIG 82 03/15/2023   CHOLHDL 3.2 03/15/2023    Continue weight loss therapy, losing 10% or more of body weight may improve condition. Also advised to reduce saturated fats in diet to less than 10% of daily calories.   HIV increases the risk of cardiovascular disease so she may benefit from statin therapy for an LDL goal of less than 70.

## 2023-08-30 NOTE — Assessment & Plan Note (Signed)
 Obesity with associated prediabetes and insulin  resistance. Current weight management plan includes a 1200 calorie diet with good adherence, exercise, and hydration. Recent weight loss of 3 pounds. Metabolic rate is lower than expected, contributing to slow metabolism. Fasting blood sugar is 101 mg/dL, and J8r is 3.9%, indicating prediabetes. Insulin  levels are elevated at 15, indicating insulin  resistance. HIV medications may affect glucose metabolism. Discussed the role of fat accumulation in insulin  resistance and the importance of exercise and protein intake in managing weight and metabolism. Metformin  reduces diabetes risk by 30%, aids in weight loss, and improves cholesterol levels. Discussed potential side effect of loose stools if consuming sugary foods. - Initiate metformin  XR, 1 tablet daily with the first meal. - Continue 1200 calorie diet with emphasis on whole foods and protein intake. - Encourage exercise, aiming for 115 minutes per week.

## 2023-08-30 NOTE — Assessment & Plan Note (Signed)
 Most recent A1c is  Lab Results  Component Value Date   HGBA1C 6.0 (H) 07/25/2023    Patient aware of disease state and risk of progression. This may contribute to abnormal cravings, fatigue and diabetic complications without having diabetes.   We have discussed treatment options which include: losing 7 to 10% of body weight, increasing physical activity to a goal of 150 minutes a week at moderate intensity.  Advised to maintain a diet low on simple and processed carbohydrates.  After discussion of benefits and side effects will be started on metformin  XR 500 mg once a day

## 2023-08-30 NOTE — Assessment & Plan Note (Signed)
 Blood pressure recorded at 136/86 mmHg. Discussed new guidelines recommending treatment at this level if lifestyle changes do not improve it. Emphasized the importance of reducing sodium intake and the potential impact of weight loss on blood pressure. Highlighted increased risk of stroke, kidney disease, and heart failure in African American community related to blood pressure issues. - Monitor blood pressure regularly. - Reduce sodium intake, focusing on reducing prepackaged foods. - Continue weight management efforts.

## 2023-08-30 NOTE — Assessment & Plan Note (Signed)
 Patient has a slower than predicted metabolism. IC 1339 vs. calculated 1642. This may contribute to weight gain, chronic fatigue and difficulty losing weight.   We reviewed measures to improve metabolism including not skipping meals, progressive strengthening exercises, increasing protein intake at every meal and maintaining adequate hydration and sleep.

## 2023-08-30 NOTE — Progress Notes (Addendum)
 Office: [phone]  /  Fax: [phone]  Weight Summary and Body Composition Analysis (BIA)  Vitals Temp: (!) 97.4 F (36.3 C) BP: 136/86 Pulse Rate: (!) 59 SpO2: 98 %   Anthropometric Measurements Height: 5' 4 (1.626 m) Weight: 212 lb (96.2 kg) BMI (Calculated): 36.37 Weight at Last Visit: 215 lb Weight Lost Since Last Visit: 3 lb Weight Gained Since Last Visit: 0 lb Starting Weight: 215 lb Total Weight Loss (lbs): 3 lb (1.361 kg) Peak Weight: 245 lb   Body Composition  Body Fat %: 44.8 % Fat Mass (lbs): 95.2 lbs Muscle Mass (lbs): 111.4 lbs Total Body Water (lbs): 82.2 lbs Visceral Fat Rating : 13   The 10-year ASCVD risk score (Arnett DK, et al., 2019) is: 4.5%   RMR: 1339  Today's Visit #: 2  Starting Date: 07/25/23   Subjective   Chief Complaint: Obesity  Interval History Discussed the use of AI scribe software for clinical note transcription with the patient, who gave verbal consent to proceed.  History of Present Illness   Anne Joyce is a 58 year old female with dyslipidemia, prediabetes, and HIV who presents for medical weight management.  She has lost three pounds since her last visit and adheres to a 1200 calorie nutrition plan, focusing on whole foods, adequate protein intake, and hydration. She does not skip meals and feels full after eating. Her typical diet includes a morning protein shake with fruit, Greek yogurt, and a single meal later in the day. 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 Occasionally, she snacks on sweets like Oreo cookies or chips but does not consider herself a frequent snacker.  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 She exercises three days a week for 45 to 50 minutes. Sometimes she snores but does not wake up gasping for air and generally feels rested upon  waking.  She has a history of eating two meals a day since high school, influenced by her athletic background. She does not feel hungry all the time and eats until she is full, not stuffed. She prefers whole foods and has been incorporating more vegetables and fruits into her diet.  Her past medical history includes HIV, which she manages with medication. Her recent blood work shows a fasting blood sugar of 101, placing her in the prediabetes range, and an A1c of 6.0. Insulin  levels are elevated at 15. Kidney and liver functions are normal, and vitamin D  and B12 levels are adequate.  Her family history includes cancer, with her grandmother having died of breast and kidney cancer. She is up to date with her cancer screenings, including mammograms and Pap smears.       Challenges affecting patient progress: slow metabolism for age and menopause.    Pharmacotherapy for weight management: She is currently taking no anti-obesity medication.   Assessment and Plan   Treatment Plan For Obesity:  Recommended Dietary Goals  Keanu is currently in the action stage of change. As such, her goal is to continue weight management plan. She has agreed to: incorporate 1-2 meal replacements a day for convenience  and continue current plan  Behavioral Health and Counseling  We discussed the following behavioral modification strategies today: increasing lean protein intake to established goals, decreasing simple carbohydrates , increasing vegetables, increasing fiber rich foods, avoiding skipping meals,  increasing water intake , work on meal planning and preparation, and continue to work on maintaining a reduced calorie state, getting the recommended amount of protein, incorporating whole foods, making healthy choices, staying well hydrated and practicing mindfulness when eating..  Additional education and resources provided today: None  Recommended Physical Activity Goals  Cameron has been advised to work  up to 150 minutes of moderate intensity aerobic activity a week and strengthening exercises 2-3 times per week for cardiovascular health, weight loss maintenance and preservation of muscle mass.   She has agreed to :  Increase the intensity, frequency or duration of strengthening exercises  and Increase the intensity, frequency or duration of aerobic exercises    Medical Interventions and Pharmacotherapy  We discussed various medication options to help Nyrah with her weight loss efforts and we both agreed to : Start metformin  XR 500 mg once a day for diabetes prevention and weight management  Associated Conditions Impacted by Obesity Treatment  Assessment & Plan Prediabetes Most recent A1c is  Lab Results  Component Value Date   HGBA1C 6.0 (H) 07/25/2023    Patient aware of disease state and risk of progression. This may contribute to abnormal cravings, fatigue and diabetic complications without having diabetes.   We have discussed treatment options which include: losing 7 to 10% of body weight, increasing physical activity to a goal of 150 minutes a week at moderate intensity.  Advised to maintain a diet low on simple and processed carbohydrates.  After discussion of benefits and side effects will be started on metformin  XR 500 mg once a day  Abnormal metabolism Patient has a slower than predicted metabolism. IC 1339 vs. calculated 1642. This may contribute to weight gain, chronic fatigue and difficulty losing weight.   We reviewed measures to improve metabolism including not skipping meals, progressive strengthening exercises, increasing protein intake at every meal and maintaining adequate hydration and sleep.   Human immunodeficiency virus (HIV) disease (HCC) On HAART, with sustained virological response and normal CD4 count.  Patient counseled on HIV being a chronic inflammatory state and how to increase cardiovascular risk.  I reviewed HIV regimen for the most part not reported  to cause weight gain.  Continue current regimen follow-up with HIV specialist Class 2 severe obesity with serious comorbidity and body mass index (BMI) of 36.0 to 36.9 in adult, unspecified obesity type (HCC) Obesity with associated prediabetes and insulin  resistance. Current weight management plan includes a 1200 calorie diet with good adherence, exercise, and hydration. Recent weight loss of 3 pounds. Metabolic rate is lower than expected, contributing to slow metabolism. Fasting blood sugar is 101 mg/dL, and J8r is 3.9%, indicating prediabetes. Insulin  levels are elevated at 15, indicating insulin  resistance. HIV medications may affect glucose metabolism. Discussed the role of fat accumulation in insulin  resistance and the importance of exercise and protein intake in managing weight and metabolism. Metformin  reduces diabetes risk by 30%, aids in weight loss, and improves cholesterol levels. Discussed potential side effect of loose stools if consuming sugary foods. - Initiate metformin  XR, 1 tablet daily with the first meal. - Continue 1200 calorie diet with emphasis on whole foods and protein intake. - Encourage exercise, aiming for 115 minutes per week. Dyslipidemia LDL is not at goal. Elevated LDL may be secondary to nutrition, genetics and spillover effect from excess adiposity. Recommended LDL goal is <70 to reduce the risk of fatty streaks and the progression to obstructive ASCVD in the future.   Her 10 year risk  is: The 10-year ASCVD risk score (Arnett DK, et al., 2019) is: 4.5%.  Considering that she has chronic HIV her cardiovascular risk is elevated.  Lab Results  Component Value Date   CHOL 193 03/15/2023   HDL 60 03/15/2023   LDLCALC 115 (H) 03/15/2023   TRIG 82 03/15/2023   CHOLHDL 3.2 03/15/2023    Continue weight loss therapy, losing 10% or more of body weight may improve condition. Also advised to reduce saturated fats in diet to less than 10% of daily calories.   HIV  increases the risk of cardiovascular disease so she may benefit from statin therapy for an LDL goal of less than 70.    Elevated blood pressure reading without diagnosis of hypertension Blood pressure recorded at 136/86 mmHg. Discussed new guidelines recommending treatment at this level if lifestyle changes do not improve it. Emphasized the importance of reducing sodium intake and the potential impact of weight loss on blood pressure. Highlighted increased risk of stroke, kidney disease, and heart failure in African American community related to blood pressure issues. - Monitor blood pressure regularly. - Reduce sodium intake, focusing on reducing prepackaged foods. - Continue weight management efforts.   From cancer prevention She is up-to-date on mammograms Denies history of abnormal Pap smears She reports having a baseline normal colonoscopy and is not due for repeat She is a lifelong non-smoker       Objective   Physical Exam:  Blood pressure 136/86, pulse (!) 59, temperature (!) 97.4 F (36.3 C), height 5' 4 (1.626 m), weight 212 lb (96.2 kg), last menstrual period 07/24/2012, SpO2 98%. Body mass index is 36.39 kg/m.  General: She is overweight, cooperative, alert, well developed, and in no acute distress. PSYCH: Has normal mood, affect and thought process.   HEENT: EOMI, sclerae are anicteric. Lungs: Normal breathing effort, no conversational dyspnea. Extremities: No edema.  Neurologic: No gross sensory or motor deficits. No tremors or fasciculations noted.    Diagnostic Data Reviewed:  BMET    Component Value Date/Time   NA 141 07/25/2023 1106   K 4.4 07/25/2023 1106   CL 104 07/25/2023 1106   CO2 23 07/25/2023 1106   GLUCOSE 101 (H) 07/25/2023 1106   GLUCOSE 99 03/15/2023 0913   BUN 13 07/25/2023 1106   CREATININE 0.80 07/25/2023 1106   CREATININE 0.87 03/15/2023 0913   CALCIUM  9.7 07/25/2023 1106   GFRNONAA 75 10/04/2017 0916   GFRAA 86 10/04/2017 0916    Lab Results  Component Value Date   HGBA1C 6.0 (H) 07/25/2023   Lab Results  Component Value Date   INSULIN  15.1 07/25/2023   Lab Results  Component Value Date   TSH 2.950 07/25/2023   CBC    Component Value Date/Time   WBC 5.6 03/15/2023 0913   RBC 4.02 03/15/2023 0913   HGB 11.8 03/15/2023 0913   HCT 37.0 03/15/2023 0913   PLT 297 03/15/2023 0913   MCV 92.0 03/15/2023 0913   MCH 29.4 03/15/2023 0913   MCHC 31.9 (L) 03/15/2023 0913   RDW 13.0 03/15/2023 0913   Iron  Studies No results found for: IRON , TIBC, FERRITIN, IRONPCTSAT Lipid Panel     Component Value Date/Time   CHOL 193 03/15/2023 0913   TRIG 82 03/15/2023 0913   HDL 60 03/15/2023 0913   CHOLHDL 3.2 03/15/2023 0913   VLDL 14 03/23/2016 0955   LDLCALC 115 (H) 03/15/2023 0913   Hepatic Function Panel     Component Value Date/Time   PROT 7.2  07/25/2023 1106   ALBUMIN 4.3 07/25/2023 1106   AST 12 07/25/2023 1106   ALT 12 07/25/2023 1106   ALKPHOS 83 07/25/2023 1106   BILITOT 0.3 07/25/2023 1106      Component Value Date/Time   TSH 2.950 07/25/2023 1106   Nutritional Lab Results  Component Value Date   VD25OH 47.9 07/25/2023    Medications: Outpatient Encounter Medications as of 08/30/2023  Medication Sig   acetaminophen  (TYLENOL ) 500 MG tablet Take 500 mg by mouth every 6 (six) hours as needed. prn   COD LIVER OIL PO Take 1 capsule by mouth daily.   Cyanocobalamin  (VITAMIN B 12 PO) Take by mouth.   elvitegravir-cobicistat-emtricitabine-tenofovir (GENVOYA ) 150-150-200-10 MG TABS tablet Take 1 tablet by mouth daily with breakfast.   Ferrous Sulfate  (IRON ) 325 (65 Fe) MG TABS Take by mouth. Unknown dosage   gabapentin  (NEURONTIN ) 300 MG capsule Take 1 capsule (300 mg total) by mouth 2 (two) times daily.   metFORMIN  (GLUCOPHAGE -XR) 500 MG 24 hr tablet Take 1 tablet (500 mg total) by mouth daily with breakfast.   Multiple Vitamin (MULTIVITAMIN) tablet Take 1 tablet by mouth daily.    Omega-3 Fatty Acids  (OMEGA 3 500 PO) Take by mouth.   No facility-administered encounter medications on file as of 08/30/2023.     Follow-Up   Return in about 3 weeks (around 09/20/2023) for For Weight Mangement with Dr. Francyne.SABRA She was informed of the importance of frequent follow up visits to maximize her success with intensive lifestyle modifications for her multiple health conditions.  Attestation Statement   Reviewed by clinician on day of visit: allergies, medications, problem list, medical history, surgical history, family history, social history, and previous encounter notes.   I have spent 44 minutes in the care of the patient today including: 3 minutes before the visit reviewing and preparing the chart. 35 minutes face-to-face assessing and reviewing listed medical problems as outlined in obesity care plan, providing nutritional and behavioral counseling on topics outlined in the obesity care plan, counseling regarding anti-obesity medication as outlined in obesity care plan, independently interpreting test results and goals of care, as described in assessment and plan, reviewing and discussing biometric information and progress, and ordering medications - see orders 6 minutes after the visit updating chart and documentation of encounter.    Lucas Francyne, MD

## 2023-09-03 ENCOUNTER — Ambulatory Visit (INDEPENDENT_AMBULATORY_CARE_PROVIDER_SITE_OTHER): Admitting: Internal Medicine

## 2023-09-20 ENCOUNTER — Ambulatory Visit
Admission: RE | Admit: 2023-09-20 | Discharge: 2023-09-20 | Disposition: A | Discharge status: Home / Self Care | Source: Ambulatory Visit | Attending: Family | Admitting: Family

## 2023-09-20 DIAGNOSIS — Z1231 Encounter for screening mammogram for malignant neoplasm of breast: Secondary | ICD-10-CM

## 2023-09-26 ENCOUNTER — Ambulatory Visit (INDEPENDENT_AMBULATORY_CARE_PROVIDER_SITE_OTHER): Admitting: Internal Medicine

## 2023-10-01 ENCOUNTER — Telehealth: Payer: Self-pay | Admitting: Radiology

## 2023-10-01 ENCOUNTER — Encounter (INDEPENDENT_AMBULATORY_CARE_PROVIDER_SITE_OTHER): Payer: Self-pay | Admitting: Internal Medicine

## 2023-10-01 ENCOUNTER — Ambulatory Visit (INDEPENDENT_AMBULATORY_CARE_PROVIDER_SITE_OTHER): Admitting: Internal Medicine

## 2023-10-01 VITALS — BP 138/86 | HR 60 | Temp 98.2°F | Ht 64.0 in | Wt 204.0 lb

## 2023-10-01 DIAGNOSIS — R7303 Prediabetes: Secondary | ICD-10-CM | POA: Diagnosis not present

## 2023-10-01 DIAGNOSIS — R030 Elevated blood-pressure reading, without diagnosis of hypertension: Secondary | ICD-10-CM | POA: Diagnosis not present

## 2023-10-01 DIAGNOSIS — R948 Abnormal results of function studies of other organs and systems: Secondary | ICD-10-CM | POA: Diagnosis not present

## 2023-10-01 DIAGNOSIS — Z6836 Body mass index (BMI) 36.0-36.9, adult: Secondary | ICD-10-CM

## 2023-10-01 DIAGNOSIS — E66812 Obesity, class 2: Secondary | ICD-10-CM | POA: Diagnosis not present

## 2023-10-01 NOTE — Assessment & Plan Note (Signed)
 Patient has a slower than predicted metabolism. IC 1339 vs. calculated 1642. This may contribute to weight gain, chronic fatigue and difficulty losing weight.   We reviewed measures to improve metabolism including not skipping meals, progressive strengthening exercises, increasing protein intake at every meal and maintaining adequate hydration and sleep.   She has increased volume of physical activity which is having a significant impact on her weight loss.  Will continue with current weight management strategy

## 2023-10-01 NOTE — Assessment & Plan Note (Signed)
 Weight: decrease of 11 lb (5.1%) over 2 months  Start: 07/25/2023 215 lb (97.5 kg)  End: 10/01/2023 204 lb (92.5 kg)  Continue current weight management strategies see above.

## 2023-10-01 NOTE — Progress Notes (Deleted)
 Office: [phone]  /  Fax: [phone]  Weight Summary and Body Composition Analysis (BIA)  Vitals Temp: 98.2 F (36.8 C) BP: 138/86 Pulse Rate: 60 SpO2: 99 %   Anthropometric Measurements Height: 5' 4 (1.626 m) Weight: 204 lb (92.5 kg) BMI (Calculated): 35 Weight at Last Visit: 212 lb Weight Lost Since Last Visit: 8 lb Weight Gained Since Last Visit: 0 lb Starting Weight: 215 lb Total Weight Loss (lbs): 11 lb (4.99 kg) Peak Weight: 245 lb   Body Composition  Body Fat %: 42.8 % Fat Mass (lbs): 87.4 lbs Muscle Mass (lbs): 111 lbs Total Body Water (lbs): 77.6 lbs Visceral Fat Rating : 12    RMR: 1339  Today's Visit #: 3  Starting Date: 07/25/23   Subjective   Chief Complaint: Obesity  Interval History Discussed the use of AI scribe software for clinical note transcription with the patient, who gave verbal consent to proceed.  History of Present Illness      Challenges affecting patient progress: {EMOBESITYBARRIERS:28841::none}.    Pharmacotherapy for weight management: She is currently taking {EMPharmaco:28845}.   Assessment and Plan   Treatment Plan For Obesity:  Recommended Dietary Goals  Anne Joyce is currently in the action stage of change. As such, her goal is to continue weight management plan. She has agreed to: {EMWTLOSSPLAN:29297::continue current plan}  Behavioral Health and Counseling  We discussed the following behavioral modification strategies today: {EMWMwtlossstrategies:28914::continue to work on maintaining a reduced calorie state, getting the recommended amount of protein, incorporating whole foods, making healthy choices, staying well hydrated and practicing mindfulness when eating.,increase protein intake, fibrous foods (25 grams per day for women, 30 grams for men) and water to improve satiety and decrease hunger signals. }.  Additional education and resources provided  today: {EMadditionalresources:29169::None}  Recommended Physical Activity Goals  Anne Joyce has been advised to work up to 150 minutes of moderate intensity aerobic activity a week and strengthening exercises 2-3 times per week for cardiovascular health, weight loss maintenance and preservation of muscle mass.  She has agreed to :  {EMEXERCISE:28847::Think about enjoyable ways to increase daily physical activity and overcoming barriers to exercise,Increase physical activity in their day and reduce sedentary time (increase NEAT).,Increase volume of physical activity to a goal of 240 minutes a week,Combine aerobic and strengthening exercises for efficiency and improved cardiometabolic health.}  Medical Interventions and Pharmacotherapy  We discussed various medication options to help Anne Joyce with her weight loss efforts and we both agreed to : {EMagreedrx:29170}  Associated Conditions Impacted by Obesity Treatment  Assessment & Plan Prediabetes  Abnormal metabolism  Elevated blood pressure reading without diagnosis of hypertension  Class 2 severe obesity with serious comorbidity and body mass index (BMI) of 36.0 to 36.9 in adult, unspecified obesity type (HCC)     Assessment and Plan Assessment & Plan       Objective   Physical Exam:  Blood pressure 138/86, pulse 60, temperature 98.2 F (36.8 C), height 5' 4 (1.626 m), weight 204 lb (92.5 kg), last menstrual period 07/24/2012, SpO2 99%. Body mass index is 35.02 kg/m.  General: She is overweight, cooperative, alert, well developed, and in no acute distress. PSYCH: Has normal mood, affect and thought process.   HEENT: EOMI, sclerae are anicteric. Lungs: Normal breathing effort, no conversational dyspnea. Extremities: No edema.  Neurologic: No gross sensory or motor deficits. No tremors or fasciculations noted.    Diagnostic Data Reviewed:  BMET    Component Value Date/Time   NA 141 07/25/2023 1106  K 4.4  07/25/2023 1106   CL 104 07/25/2023 1106   CO2 23 07/25/2023 1106   GLUCOSE 101 (H) 07/25/2023 1106   GLUCOSE 99 03/15/2023 0913   BUN 13 07/25/2023 1106   CREATININE 0.80 07/25/2023 1106   CREATININE 0.87 03/15/2023 0913   CALCIUM  9.7 07/25/2023 1106   GFRNONAA 75 10/04/2017 0916   GFRAA 86 10/04/2017 0916   Lab Results  Component Value Date   HGBA1C 6.0 (H) 07/25/2023   Lab Results  Component Value Date   INSULIN  15.1 07/25/2023   Lab Results  Component Value Date   TSH 2.950 07/25/2023   CBC    Component Value Date/Time   WBC 5.6 03/15/2023 0913   RBC 4.02 03/15/2023 0913   HGB 11.8 03/15/2023 0913   HCT 37.0 03/15/2023 0913   PLT 297 03/15/2023 0913   MCV 92.0 03/15/2023 0913   MCH 29.4 03/15/2023 0913   MCHC 31.9 (L) 03/15/2023 0913   RDW 13.0 03/15/2023 0913   Iron  Studies No results found for: IRON , TIBC, FERRITIN, IRONPCTSAT Lipid Panel     Component Value Date/Time   CHOL 193 03/15/2023 0913   TRIG 82 03/15/2023 0913   HDL 60 03/15/2023 0913   CHOLHDL 3.2 03/15/2023 0913   VLDL 14 03/23/2016 0955   LDLCALC 115 (H) 03/15/2023 0913   Hepatic Function Panel     Component Value Date/Time   PROT 7.2 07/25/2023 1106   ALBUMIN 4.3 07/25/2023 1106   AST 12 07/25/2023 1106   ALT 12 07/25/2023 1106   ALKPHOS 83 07/25/2023 1106   BILITOT 0.3 07/25/2023 1106      Component Value Date/Time   TSH 2.950 07/25/2023 1106   Nutritional Lab Results  Component Value Date   VD25OH 47.9 07/25/2023    Medications: Outpatient Encounter Medications as of 10/01/2023  Medication Sig   acetaminophen  (TYLENOL ) 500 MG tablet Take 500 mg by mouth every 6 (six) hours as needed. prn   COD LIVER OIL PO Take 1 capsule by mouth daily.   Cyanocobalamin  (VITAMIN B 12 PO) Take by mouth.   elvitegravir-cobicistat-emtricitabine-tenofovir (GENVOYA ) 150-150-200-10 MG TABS tablet Take 1 tablet by mouth daily with breakfast.   Ferrous Sulfate  (IRON ) 325 (65 Fe) MG TABS  Take by mouth. Unknown dosage   gabapentin  (NEURONTIN ) 300 MG capsule Take 1 capsule (300 mg total) by mouth 2 (two) times daily.   metFORMIN  (GLUCOPHAGE -XR) 500 MG 24 hr tablet Take 1 tablet (500 mg total) by mouth daily with breakfast.   Multiple Vitamin (MULTIVITAMIN) tablet Take 1 tablet by mouth daily.   Omega-3 Fatty Acids  (OMEGA 3 500 PO) Take by mouth.   No facility-administered encounter medications on file as of 10/01/2023.     Follow-Up   No follow-ups on file.SABRA She was informed of the importance of frequent follow up visits to maximize her success with intensive lifestyle modifications for her multiple health conditions.  Attestation Statement   Reviewed by clinician on day of visit: allergies, medications, problem list, medical history, surgical history, family history, social history, and previous encounter notes.     Lucas Parker, MD

## 2023-10-01 NOTE — Progress Notes (Signed)
 Office: [phone]  /  Fax: [phone]  Weight Summary And Body Composition Analysis (BIA)  Vitals Temp: 98.2 F (36.8 C) BP: 138/86 Pulse Rate: 60 SpO2: 99 %   Anthropometric Measurements Height: 5' 4 (1.626 m) Weight: 204 lb (92.5 kg) BMI (Calculated): 35 Weight at Last Visit: 212 lb Weight Lost Since Last Visit: 8 lb Weight Gained Since Last Visit: 0 lb Starting Weight: 215 lb Total Weight Loss (lbs): 11 lb (4.99 kg) Peak Weight: 245 lb   Body Composition  Body Fat %: 42.8 % Fat Mass (lbs): 87.4 lbs Muscle Mass (lbs): 111 lbs Total Body Water (lbs): 77.6 lbs Visceral Fat Rating : 12    RMR: 1339  Today's Visit #: 3  Starting Date: 07/25/23   Subjective   Chief Complaint: Obesity  Anne Joyce is here to discuss her progress with her obesity treatment plan. She is following the Category 2 plan - 1200 kcal per day and states she is following her eating plan approximately 90-100% of the time. She states she is exercising 90 minutes 5 times per week..  Weight Progress Since Last Visit:  Since last office visit she has lost 8 pounds. She reports good adherence to reduced calorie nutritional plan. She has been working on reading food labels, not skipping meals, increasing protein intake at every meal, eating more vegetables, drinking more water, avoiding and / or reducing liquid calories, avoiding or reducing simple and processed carbohydrates, making healthier choices, continues to exercise, working on meal prepping, reducing portion sizes, incorporating more whole foods, and increasing physical activity levels   Nutritional 24 HR Recall: Intake consistent with prescribed nutritional plan  Challenges affecting patient progress: none.   Orexigenic Control: Reports improved problems with appetite and hunger signals.  Denies problems with satiety and satiation.  Denies problems with eating patterns and portion control.  Denies abnormal cravings. Denies  feeling deprived or restricted.   Pharmacotherapy for weight management: She is currently taking no anti-obesity medication and she had been prescribed metformin  last office visit but never took medication as she lost prescription due to her recent move.   Assessment and Plan   Treatment Plan For Obesity:  Recommended Dietary Goals  Anne Joyce is currently in the action stage of change. As such, her goal is to continue weight management plan. She has agreed to: continue current plan  Behavioral Health and Counseling  We discussed the following behavioral modification strategies today: continue to work on maintaining a reduced calorie state, getting the recommended amount of protein, incorporating whole foods, making healthy choices, staying well hydrated and practicing mindfulness when eating. and increase protein intake, fibrous foods (25 grams per day for women, 30 grams for men) and water to improve satiety and decrease hunger signals. .  Additional education and resources provided today: None  Recommended Physical Activity Goals  Anne Joyce has been advised to work up to 150 minutes of moderate intensity aerobic activity a week and strengthening exercises 2-3 times per week for cardiovascular health, weight loss maintenance and preservation of muscle mass.   She has agreed to :  Increase volume of physical activity to a goal of 240 minutes a week and Combine aerobic and strengthening exercises for efficiency and improved cardiometabolic health.  Pharmacotherapy and Medical Interventions  Continue with current nutritional and behavioral strategies  Associated Conditions Impacted by Obesity Treatment  Assessment & Plan Prediabetes Most recent A1c is  Lab Results  Component Value Date   HGBA1C 6.0 (H) 07/25/2023    Patient  aware of disease state and risk of progression. This may contribute to abnormal cravings, fatigue and diabetic complications without having diabetes.   We have  discussed treatment options which include: losing 7 to 10% of body weight, increasing physical activity to a goal of 150 minutes a week at moderate intensity.  Advised to maintain a diet low on simple and processed carbohydrates.  She has been prescribed metformin  last office visit but lost prescription she would like to continue with lifestyle changes alone.  We will consider using metformin  for pharmacoprophylaxis in the future.  Abnormal metabolism Patient has a slower than predicted metabolism. IC 1339 vs. calculated 1642. This may contribute to weight gain, chronic fatigue and difficulty losing weight.   We reviewed measures to improve metabolism including not skipping meals, progressive strengthening exercises, increasing protein intake at every meal and maintaining adequate hydration and sleep.   She has increased volume of physical activity which is having a significant impact on her weight loss.  Will continue with current weight management strategy  Elevated blood pressure reading without diagnosis of hypertension Blood pressure still remains above target she has been losing weight steadily anticipate improvement in blood pressure.  Continue monitoring for now if blood pressure elevations persist despite weight loss she may benefit from an antihypertensive considering that she has HIV which may increase cardiovascular risk. Class 2 severe obesity with serious comorbidity and body mass index (BMI) of 36.0 to 36.9 in adult, unspecified obesity type (HCC) Weight: decrease of 11 lb (5.1%) over 2 months  Start: 07/25/2023 215 lb (97.5 kg)  End: 10/01/2023 204 lb (92.5 kg)  Continue current weight management strategies see above.    Objective   Physical Exam:  Blood pressure 138/86, pulse 60, temperature 98.2 F (36.8 C), height 5' 4 (1.626 m), weight 204 lb (92.5 kg), last menstrual period 07/24/2012, SpO2 99%. Body mass index is 35.02 kg/m.  General: She is overweight,  cooperative, alert, well developed, and in no acute distress. PSYCH: Has normal mood, affect and thought process.   HEENT: EOMI, sclerae are anicteric. Lungs: Normal breathing effort, no conversational dyspnea. Extremities: No edema.  Neurologic: No gross sensory or motor deficits. No tremors or fasciculations noted.    Diagnostic Data Reviewed:  BMET    Component Value Date/Time   NA 141 07/25/2023 1106   K 4.4 07/25/2023 1106   CL 104 07/25/2023 1106   CO2 23 07/25/2023 1106   GLUCOSE 101 (H) 07/25/2023 1106   GLUCOSE 99 03/15/2023 0913   BUN 13 07/25/2023 1106   CREATININE 0.80 07/25/2023 1106   CREATININE 0.87 03/15/2023 0913   CALCIUM  9.7 07/25/2023 1106   GFRNONAA 75 10/04/2017 0916   GFRAA 86 10/04/2017 0916   Lab Results  Component Value Date   HGBA1C 6.0 (H) 07/25/2023   Lab Results  Component Value Date   INSULIN  15.1 07/25/2023   Lab Results  Component Value Date   TSH 2.950 07/25/2023   CBC    Component Value Date/Time   WBC 5.6 03/15/2023 0913   RBC 4.02 03/15/2023 0913   HGB 11.8 03/15/2023 0913   HCT 37.0 03/15/2023 0913   PLT 297 03/15/2023 0913   MCV 92.0 03/15/2023 0913   MCH 29.4 03/15/2023 0913   MCHC 31.9 (L) 03/15/2023 0913   RDW 13.0 03/15/2023 0913   Iron  Studies No results found for: IRON , TIBC, FERRITIN, IRONPCTSAT Lipid Panel     Component Value Date/Time   CHOL 193 03/15/2023 0913   TRIG  82 03/15/2023 0913   HDL 60 03/15/2023 0913   CHOLHDL 3.2 03/15/2023 0913   VLDL 14 03/23/2016 0955   LDLCALC 115 (H) 03/15/2023 0913   Hepatic Function Panel     Component Value Date/Time   PROT 7.2 07/25/2023 1106   ALBUMIN 4.3 07/25/2023 1106   AST 12 07/25/2023 1106   ALT 12 07/25/2023 1106   ALKPHOS 83 07/25/2023 1106   BILITOT 0.3 07/25/2023 1106      Component Value Date/Time   TSH 2.950 07/25/2023 1106   Nutritional Lab Results  Component Value Date   VD25OH 47.9 07/25/2023    Medications: Outpatient  Encounter Medications as of 10/01/2023  Medication Sig Note   acetaminophen  (TYLENOL ) 500 MG tablet Take 500 mg by mouth every 6 (six) hours as needed. prn    COD LIVER OIL PO Take 1 capsule by mouth daily.    Cyanocobalamin  (VITAMIN B 12 PO) Take by mouth.    elvitegravir-cobicistat-emtricitabine-tenofovir (GENVOYA ) 150-150-200-10 MG TABS tablet Take 1 tablet by mouth daily with breakfast.    Ferrous Sulfate  (IRON ) 325 (65 Fe) MG TABS Take by mouth. Unknown dosage    gabapentin  (NEURONTIN ) 300 MG capsule Take 1 capsule (300 mg total) by mouth 2 (two) times daily.    Multiple Vitamin (MULTIVITAMIN) tablet Take 1 tablet by mouth daily.    Omega-3 Fatty Acids  (OMEGA 3 500 PO) Take by mouth.    [DISCONTINUED] metFORMIN  (GLUCOPHAGE -XR) 500 MG 24 hr tablet Take 1 tablet (500 mg total) by mouth daily with breakfast. 10/01/2023: Will like to hold   No facility-administered encounter medications on file as of 10/01/2023.     Follow-Up   Return in about 3 weeks (around 10/22/2023) for For Weight Mangement with Dr. Francyne.SABRA She was informed of the importance of frequent follow up visits to maximize her success with intensive lifestyle modifications for her multiple health conditions.  Attestation Statement   Reviewed by clinician on day of visit: allergies, medications, problem list, medical history, surgical history, family history, social history, and previous encounter notes.     Lucas Francyne, MD

## 2023-10-01 NOTE — Assessment & Plan Note (Signed)
 Blood pressure still remains above target she has been losing weight steadily anticipate improvement in blood pressure.  Continue monitoring for now if blood pressure elevations persist despite weight loss she may benefit from an antihypertensive considering that she has HIV which may increase cardiovascular risk.
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
## 2023-10-01 NOTE — Assessment & Plan Note (Signed)
 Most recent A1c is  Lab Results  Component Value Date   HGBA1C 6.0 (H) 07/25/2023    Patient aware of disease state and risk of progression. This may contribute to abnormal cravings, fatigue and diabetic complications without having diabetes.   We have discussed treatment options which include: losing 7 to 10% of body weight, increasing physical activity to a goal of 150 minutes a week at moderate intensity.  Advised to maintain a diet low on simple and processed carbohydrates.  She has been prescribed metformin  last office visit but lost prescription she would like to continue with lifestyle changes alone.  We will consider using metformin  for pharmacoprophylaxis in the future.

## 2023-10-02 NOTE — Telephone Encounter (Signed)
 I called patient back, and scheduled w/ Maureen tomorrow for eval.  Patient did say swelling has gone down some since elevating and icing.

## 2023-10-03 ENCOUNTER — Ambulatory Visit (INDEPENDENT_AMBULATORY_CARE_PROVIDER_SITE_OTHER): Admitting: Physician Assistant

## 2023-10-03 ENCOUNTER — Encounter: Payer: Self-pay | Admitting: Physician Assistant

## 2023-10-03 ENCOUNTER — Other Ambulatory Visit: Payer: Self-pay

## 2023-10-03 DIAGNOSIS — M722 Plantar fascial fibromatosis: Secondary | ICD-10-CM

## 2023-10-03 DIAGNOSIS — M25572 Pain in left ankle and joints of left foot: Secondary | ICD-10-CM

## 2023-10-03 DIAGNOSIS — M7672 Peroneal tendinitis, left leg: Secondary | ICD-10-CM | POA: Diagnosis not present

## 2023-10-03 MED ORDER — PREDNISONE 10 MG PO TABS: 10.0000 mg | ORAL_TABLET | Freq: Every day | ORAL | 1 refills | Status: DC
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
## 2023-10-22 ENCOUNTER — Encounter (INDEPENDENT_AMBULATORY_CARE_PROVIDER_SITE_OTHER): Payer: Self-pay | Admitting: Internal Medicine

## 2023-10-22 ENCOUNTER — Ambulatory Visit (INDEPENDENT_AMBULATORY_CARE_PROVIDER_SITE_OTHER): Admitting: Internal Medicine

## 2023-10-22 VITALS — BP 131/77 | HR 57 | Temp 98.3°F | Ht 64.0 in | Wt 206.0 lb

## 2023-10-22 DIAGNOSIS — E66812 Obesity, class 2: Secondary | ICD-10-CM

## 2023-10-22 DIAGNOSIS — R7303 Prediabetes: Secondary | ICD-10-CM | POA: Diagnosis not present

## 2023-10-22 DIAGNOSIS — R948 Abnormal results of function studies of other organs and systems: Secondary | ICD-10-CM | POA: Diagnosis not present

## 2023-10-22 DIAGNOSIS — B20 Human immunodeficiency virus [HIV] disease: Secondary | ICD-10-CM | POA: Diagnosis not present

## 2023-10-22 DIAGNOSIS — Z6835 Body mass index (BMI) 35.0-35.9, adult: Secondary | ICD-10-CM

## 2023-10-22 NOTE — Progress Notes (Signed)
 "  Office: [phone]  /  Fax: [phone]  Weight Summary and Body Composition Analysis (BIA)  Vitals Temp: 98.3 F (36.8 C) BP: 131/77 Pulse Rate: (!) 57 SpO2: 99 %   Anthropometric Measurements Height: 5' 4 (1.626 m) Weight: 206 lb (93.4 kg) BMI (Calculated): 35.34 Weight at Last Visit: 204 lb Weight Lost Since Last Visit: 0 lb Weight Gained Since Last Visit: 2 lb Starting Weight: 215 lb Total Weight Loss (lbs): 9 lb (4.082 kg) Peak Weight: 245 lb   Body Composition  Body Fat %: 43.9 % Fat Mass (lbs): 90.6 lbs Muscle Mass (lbs): 109.8 lbs Total Body Water (lbs): 80.6 lbs Visceral Fat Rating : 12    RMR: 1339  Today's Visit #: 4  Starting Date: 07/25/23   Subjective   Chief Complaint: Obesity  Interval History Discussed the use of AI scribe software for clinical note transcription with the patient, who gave verbal consent to proceed.  History of Present Illness Anne Joyce is a 58 year old female with prediabetes and abnormal metabolism who presents for medical weight management.  She has experienced a weight gain of two pounds since her last office visit. Despite adhering to a 1200 calorie nutrition plan 90% of the time and engaging in exercise four to five days a week for 90 minutes, she notes this recent weight gain. 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  Her typical diet includes a protein shake with 30 grams of protein and berries for breakfast, a vegetable plate with chicken or a salad for lunch, and a half salad or a piece of turkey with greens for dinner. She struggles with maintaining consistent meal times and adequate protein intake due to her work schedule.  She has been working on a food truck for about two weeks, which has affected her ability to prepare meals and maintain hydration. She drinks less water to avoid using public restrooms, which she believes  impacts her weight management.  She has not started metformin  as previously discussed. She also reports not taking prednisone  or gabapentin  recently, only using gabapentin  as needed.  Her physical activity includes lifting weights on Tuesdays and Thursdays, although she missed cycling sessions last Monday and Wednesday. She maintains that her level of physical activity has not significantly changed.  She has a history of prediabetes and is on antiretroviral medications.     Challenges affecting patient progress: work schedule and menopause.    Pharmacotherapy for weight management: She is currently taking has been prescribed metformin  but has not started to take.   Assessment and Plan   Treatment Plan For Obesity:  Recommended Dietary Goals  Anne Joyce is currently in the action stage of change. As such, her goal is to continue weight management plan. She has agreed to: incorporate prepackaged healthy meals for convenience, incorporate 1-2 meal replacements a day for convenience , and continue current plan  Behavioral Health and Counseling  We discussed the following behavioral modification strategies today: continue to work on maintaining a reduced calorie state, getting the recommended amount of protein, incorporating whole foods, making healthy choices, staying well hydrated and practicing mindfulness when eating. and increase protein intake, fibrous foods (25 grams per day for women, 30 grams for men) and water to improve satiety and decrease hunger signals. .  Additional education and resources provided today: None  Recommended Physical Activity Goals  Anne Joyce has been advised to work up to 150  minutes of moderate intensity aerobic activity a week and strengthening exercises 2-3 times per week for cardiovascular health, weight loss maintenance and preservation of muscle mass.  She has agreed to :  Think about enjoyable ways to increase daily physical activity and overcoming barriers  to exercise, Increase physical activity in their day and reduce sedentary time (increase NEAT)., Increase volume of physical activity to a goal of 240 minutes a week, and Combine aerobic and strengthening exercises for efficiency and improved cardiometabolic health.  Medical Interventions and Pharmacotherapy  We discussed various medication options to help Anne Joyce with her weight loss efforts and we both agreed to : Encouraged to start metformin   Associated Conditions Impacted by Obesity Treatment  Assessment & Plan Abnormal metabolism Patient has a slower than predicted metabolism. IC 1339 vs. calculated 1642. This may contribute to weight gain, chronic fatigue and difficulty losing weight.   We reviewed measures to improve metabolism including not skipping meals, progressive strengthening exercises, increasing protein intake at every meal and maintaining adequate hydration and sleep.   She will continue to work on increasing volume of physical activity to a goal of 240 minutes a week.  Prediabetes Has been educated on the carbohydrate insulin  model for obesity. In addition to maintaining a diet low in saturated fats, simple and added sugars we recommend metformin  for pharmacoprophylaxis. She has not started medication.  We again reviewed the benefits of treatment and addressed any concerns. Class 2 severe obesity with serious comorbidity and body mass index (BMI) of 35.0 to 35.9 in adult, unspecified obesity type Weight: decrease of 9 lb (4.2%) over 2 months, 3 weeks  Start: 07/25/2023 215 lb (97.5 kg)  End: 10/22/2023 206 lb (93.4 kg)  Obesity with abnormal metabolism and prediabetes Weight gain of two pounds since last visit despite adherence to a 1200 calorie nutrition plan and regular exercise. Possible contributing factors include inconsistent meal timing, insufficient water intake, and potential grazing. Prediabetes and abnormal metabolism impact weight management. - Recommend  consistent meal timing with balanced meals: breakfast at home, a protein shake with salad and fruit for lunch, and pre-prepared meals for dinner. - Encourage increased water intake. - Consider starting metformin  to manage prediabetes and offset weight gain associated with antiretroviral therapy. - Explore meal prep options such as [employer] Life Meal Prep for convenient, calorie-specific meals. - Advise on protein-rich breakfast options such as Greek yogurt parfaits, protein smoothies, and egg white delights. - Schedule follow-up in three weeks to assess progress. Human immunodeficiency virus (HIV) disease (HCC) We discussed the metabolic effects of antiretroviral therapy.  I feel that she would benefit from metformin           Objective   Physical Exam:  Blood pressure 131/77, pulse (!) 57, temperature 98.3 F (36.8 C), height 5' 4 (1.626 m), weight 206 lb (93.4 kg), last menstrual period 07/24/2012, SpO2 99%. Body mass index is 35.36 kg/m.  General: She is overweight, cooperative, alert, well developed, and in no acute distress. PSYCH: Has normal mood, affect and thought process.   HEENT: EOMI, sclerae are anicteric. Lungs: Normal breathing effort, no conversational dyspnea. Extremities: No edema.  Neurologic: No gross sensory or motor deficits. No tremors or fasciculations noted.    Diagnostic Data Reviewed:  BMET    Component Value Date/Time   NA 141 07/25/2023 1106   K 4.4 07/25/2023 1106   CL 104 07/25/2023 1106   CO2 23 07/25/2023 1106   GLUCOSE 101 (H) 07/25/2023 1106   GLUCOSE 99 03/15/2023 0913  BUN 13 07/25/2023 1106   CREATININE 0.80 07/25/2023 1106   CREATININE 0.87 03/15/2023 0913   CALCIUM  9.7 07/25/2023 1106   GFRNONAA 75 10/04/2017 0916   GFRAA 86 10/04/2017 0916   Lab Results  Component Value Date   HGBA1C 6.0 (H) 07/25/2023   Lab Results  Component Value Date   INSULIN  15.1 07/25/2023   Lab Results  Component Value Date   TSH 2.950 07/25/2023    CBC    Component Value Date/Time   WBC 5.6 03/15/2023 0913   RBC 4.02 03/15/2023 0913   HGB 11.8 03/15/2023 0913   HCT 37.0 03/15/2023 0913   PLT 297 03/15/2023 0913   MCV 92.0 03/15/2023 0913   MCH 29.4 03/15/2023 0913   MCHC 31.9 (L) 03/15/2023 0913   RDW 13.0 03/15/2023 0913   Iron  Studies No results found for: IRON , TIBC, FERRITIN, IRONPCTSAT Lipid Panel     Component Value Date/Time   CHOL 193 03/15/2023 0913   TRIG 82 03/15/2023 0913   HDL 60 03/15/2023 0913   CHOLHDL 3.2 03/15/2023 0913   VLDL 14 03/23/2016 0955   LDLCALC 115 (H) 03/15/2023 0913   Hepatic Function Panel     Component Value Date/Time   PROT 7.2 07/25/2023 1106   ALBUMIN 4.3 07/25/2023 1106   AST 12 07/25/2023 1106   ALT 12 07/25/2023 1106   ALKPHOS 83 07/25/2023 1106   BILITOT 0.3 07/25/2023 1106      Component Value Date/Time   TSH 2.950 07/25/2023 1106   Nutritional Lab Results  Component Value Date   VD25OH 47.9 07/25/2023    Medications: Outpatient Encounter Medications as of 10/22/2023  Medication Sig Note   acetaminophen  (TYLENOL ) 500 MG tablet Take 500 mg by mouth every 6 (six) hours as needed. prn    COD LIVER OIL PO Take 1 capsule by mouth daily.    Cyanocobalamin  (VITAMIN B 12 PO) Take by mouth.    elvitegravir-cobicistat-emtricitabine-tenofovir (GENVOYA ) 150-150-200-10 MG TABS tablet Take 1 tablet by mouth daily with breakfast.    Ferrous Sulfate  (IRON ) 325 (65 Fe) MG TABS Take by mouth. Unknown dosage    gabapentin  (NEURONTIN ) 300 MG capsule Take 1 capsule (300 mg total) by mouth 2 (two) times daily.    Multiple Vitamin (MULTIVITAMIN) tablet Take 1 tablet by mouth daily.    Omega-3 Fatty Acids  (OMEGA 3 500 PO) Take by mouth.    [DISCONTINUED] predniSONE  (DELTASONE ) 10 MG tablet Take 1 tablet (10 mg total) by mouth daily with breakfast. 10/22/2023: did not take   No facility-administered encounter medications on file as of 10/22/2023.     Follow-Up   Return  in about 3 weeks (around 11/12/2023) for For Weight Mangement with Dr. Francyne.SABRA She was informed of the importance of frequent follow up visits to maximize her success with intensive lifestyle modifications for her multiple health conditions.  Attestation Statement   Reviewed by clinician on day of visit: allergies, medications, problem list, medical history, surgical history, family history, social history, and previous encounter notes.     Lucas Francyne, MD  "

## 2023-10-23 NOTE — Assessment & Plan Note (Signed)
 Weight: decrease of 9 lb (4.2%) over 2 months, 3 weeks  Start: 07/25/2023 215 lb (97.5 kg)  End: 10/22/2023 206 lb (93.4 kg)  Obesity with abnormal metabolism and prediabetes Weight gain of two pounds since last visit despite adherence to a 1200 calorie nutrition plan and regular exercise. Possible contributing factors include inconsistent meal timing, insufficient water intake, and potential grazing. Prediabetes and abnormal metabolism impact weight management. - Recommend consistent meal timing with balanced meals: breakfast at home, a protein shake with salad and fruit for lunch, and pre-prepared meals for dinner. - Encourage increased water intake. - Consider starting metformin  to manage prediabetes and offset weight gain associated with antiretroviral therapy. - Explore meal prep options such as Long Life Meal Prep for convenient, calorie-specific meals. - Advise on protein-rich breakfast options such as [place] yogurt parfaits, protein smoothies, and egg white delights. - Schedule follow-up in three weeks to assess progress.

## 2023-10-23 NOTE — Assessment & Plan Note (Signed)
 Patient has a slower than predicted metabolism. IC 1339 vs. calculated 1642. This may contribute to weight gain, chronic fatigue and difficulty losing weight.   We reviewed measures to improve metabolism including not skipping meals, progressive strengthening exercises, increasing protein intake at every meal and maintaining adequate hydration and sleep.   She will continue to work on increasing volume of physical activity to a goal of 240 minutes a week.

## 2023-10-23 NOTE — Assessment & Plan Note (Signed)
 We discussed the metabolic effects of antiretroviral therapy.  I feel that she would benefit from metformin 

## 2023-10-23 NOTE — Assessment & Plan Note (Signed)
 Has been educated on the carbohydrate insulin  model for obesity. In addition to maintaining a diet low in saturated fats, simple and added sugars we recommend metformin  for pharmacoprophylaxis. She has not started medication.  We again reviewed the benefits of treatment and addressed any concerns.

## 2023-11-12 ENCOUNTER — Encounter: Payer: Self-pay | Admitting: Radiology

## 2023-11-19 ENCOUNTER — Ambulatory Visit (INDEPENDENT_AMBULATORY_CARE_PROVIDER_SITE_OTHER): Payer: Self-pay | Admitting: Internal Medicine

## 2023-12-17 ENCOUNTER — Encounter (INDEPENDENT_AMBULATORY_CARE_PROVIDER_SITE_OTHER): Payer: Self-pay | Admitting: Internal Medicine

## 2023-12-17 ENCOUNTER — Ambulatory Visit (INDEPENDENT_AMBULATORY_CARE_PROVIDER_SITE_OTHER): Admitting: Internal Medicine

## 2023-12-17 VITALS — BP 133/66 | HR 58 | Temp 98.8°F | Ht 64.0 in | Wt 197.0 lb

## 2023-12-17 DIAGNOSIS — Z6833 Body mass index (BMI) 33.0-33.9, adult: Secondary | ICD-10-CM

## 2023-12-17 DIAGNOSIS — R948 Abnormal results of function studies of other organs and systems: Secondary | ICD-10-CM

## 2023-12-17 DIAGNOSIS — R7303 Prediabetes: Secondary | ICD-10-CM

## 2023-12-17 DIAGNOSIS — R030 Elevated blood-pressure reading, without diagnosis of hypertension: Secondary | ICD-10-CM

## 2023-12-17 DIAGNOSIS — E66811 Obesity, class 1: Secondary | ICD-10-CM

## 2023-12-17 NOTE — Assessment & Plan Note (Signed)
 Obesity with associated prediabetes and abnormal metabolic rate Significant weight loss of 18 pounds over four months through lifestyle modifications, including increased exercise and dietary changes. Body fat percentage decreased to 42%. Prefers to avoid medications like metformin . Emphasized the importance of consistency in lifestyle changes for sustainable weight loss. Discussed the role of genetics and environment in disease development and the importance of maintaining a healthy lifestyle to mitigate risks. - Continue current lifestyle modifications, including increased exercise and healthy eating. - Provided handout on staying on track during the holidays. - Will repeat A1c test at the next visit. - Discontinue metformin  she is not interested in pharmacoprophylaxis for diabetes prevention.  Continue with lifestyle interventionsPatient has a slower than predicted metabolism.  - IC 1339 vs. calculated 1642. This may contribute to weight gain, chronic fatigue and difficulty losing weight.  She has increased her physical activity  She will continue to work on increasing volume of physical activity to a goal of 240 minutes a week.

## 2023-12-17 NOTE — Assessment & Plan Note (Signed)
 Blood pressure readings have been variable, with recent elevation possibly due to stress and use of Aleve. Previous readings showed diastolic pressure in the high 19d, which should be under 80. Current reading is 133/66, which is acceptable. Discussed the impact of Aleve on blood pressure and the importance of monitoring. - Rechecked blood pressure at the end of the visit. - Advised to monitor blood pressure regularly. - Discussed the impact of Aleve on blood pressure and advised to use it sparingly.

## 2023-12-17 NOTE — Progress Notes (Signed)
 Office: [phone]  /  Fax: [phone]  Weight Summary and Body Composition Analysis (BIA)  Vitals Temp: 98.8 F (37.1 C) BP: 133/66 Pulse Rate: (!) 58 SpO2: 98 %   Anthropometric Measurements Height: 5' 4 (1.626 m) Weight: 197 lb (89.4 kg) BMI (Calculated): 33.8 Weight at Last Visit: 206 lb Weight Lost Since Last Visit: 9 lb Weight Gained Since Last Visit: 0 lb Starting Weight: 215 lb Total Weight Loss (lbs): 18 lb (8.165 kg) Peak Weight: 245 lb   Body Composition  Body Fat %: 42.2 % Fat Mass (lbs): 83.4 lbs Muscle Mass (lbs): 108.4 lbs Total Body Water (lbs): 80 lbs Visceral Fat Rating : 12    RMR: 1339  Today's Visit #: 5  Starting Date: 07/25/23   Subjective   Chief Complaint: Obesity  Interval History Discussed the use of AI scribe software for clinical note transcription with the patient, who gave verbal consent to proceed.  History of Present Illness Anne Joyce is a 58 year old female who presents for medical weight management.  Since last office visit she has lost 9 pounds.  She has achieved a weight loss of 18 pounds over the past four months through dietary changes and increased physical activity. Her dietary modifications include preparing her own meals, avoiding fried foods, and eating three times a day with the last meal before 6 PM. She has increased her exercise routine from 45 minutes to 90 minutes, incorporating cycling and weight training.  She never started metformin  and is not interested in taking medications.  She has a history of elevated blood pressure, which she attributes to recent excitement and rushing to the appointment.  She experiences occasional knee pain for which she takes Aleve or ibuprofen  once or twice a week as needed. No regular use of these medications.  Her family history includes diabetes and lupus, with her mother having had lupus and her aunt being diabetic. She is mindful of her dietary choices to  mitigate these risks, avoiding sugar and opting for water, green teas, and vitamin water zero. She also incorporates Greek yogurt with low sugar content into her diet.  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 She has made a conscious effort to avoid skipping meals, which was a previous habit due to her busy schedule. She allows herself occasional treats, such as a hot dog or banana pudding, to maintain balance in her diet.     Challenges affecting patient progress: none.    Pharmacotherapy for weight management: She is currently taking patient has declined pharmacotherapy in past.   Assessment and Plan   Treatment Plan For Obesity:  Recommended Dietary Goals  [name] is currently in the action stage of change. As such, her goal is to continue weight management plan. She has agreed to: continue current plan  Behavioral Health and Counseling  We discussed the following behavioral modification strategies today: continue to work on maintaining a reduced calorie state, getting the recommended amount of protein, incorporating whole foods, making healthy choices, staying well hydrated and practicing mindfulness when eating. and increase protein intake, fibrous foods (25 grams per day for women, 30 grams for men) and water to improve satiety and decrease hunger signals. .  Additional education and resources provided today: None  Recommended Physical Activity Goals  [name] has been advised to work up to 150 minutes of moderate intensity aerobic activity a week and strengthening exercises 2-3 times per  week for cardiovascular health, weight loss maintenance and preservation of muscle mass.  She has agreed to :  Think about enjoyable ways to increase daily physical activity and overcoming barriers to exercise, Increase physical activity in their day and reduce sedentary time (increase NEAT)., Increase volume of physical  activity to a goal of 240 minutes a week, and Combine aerobic and strengthening exercises for efficiency and improved cardiometabolic health.  Medical Interventions and Pharmacotherapy  We discussed various medication options to help [name] with her weight loss efforts and we both agreed to : Continue with current nutritional and behavioral strategies and Has declined pharmacotherapy  Associated Conditions Impacted by Obesity Treatment  Assessment & Plan Abnormal metabolism Class 1 obesity with serious comorbidity and body mass index (BMI) of 33.0 to 33.9 in adult, unspecified obesity type Prediabetes Obesity with associated prediabetes and abnormal metabolic rate Significant weight loss of 18 pounds over four months through lifestyle modifications, including increased exercise and dietary changes. Body fat percentage decreased to 42%. Prefers to avoid medications like metformin . Emphasized the importance of consistency in lifestyle changes for sustainable weight loss. Discussed the role of genetics and environment in disease development and the importance of maintaining a healthy lifestyle to mitigate risks. - Continue current lifestyle modifications, including increased exercise and healthy eating. - Provided handout on staying on track during the holidays. - Will repeat A1c test at the next visit. - Discontinue metformin  she is not interested in pharmacoprophylaxis for diabetes prevention.  Continue with lifestyle interventionsPatient has a slower than predicted metabolism.  - IC 1339 vs. calculated 1642. This may contribute to weight gain, chronic fatigue and difficulty losing weight.  She has increased her physical activity  She will continue to work on increasing volume of physical activity to a goal of 240 minutes a week.  Elevated blood pressure reading without diagnosis of hypertension Blood pressure readings have been variable, with recent elevation possibly due to stress and use of  Aleve. Previous readings showed diastolic pressure in the high 19d, which should be under 80. Current reading is 133/66, which is acceptable. Discussed the impact of Aleve on blood pressure and the importance of monitoring. - Rechecked blood pressure at the end of the visit. - Advised to monitor blood pressure regularly. - Discussed the impact of Aleve on blood pressure and advised to use it sparingly.          Objective   Physical Exam:  Blood pressure 133/66, pulse (!) 58, temperature 98.8 F (37.1 C), height 5' 4 (1.626 m), weight 197 lb (89.4 kg), last menstrual period 07/24/2012, SpO2 98%. Body mass index is 33.81 kg/m.  General: She is overweight, cooperative, alert, well developed, and in no acute distress. PSYCH: Has normal mood, affect and thought process.   HEENT: EOMI, sclerae are anicteric. Lungs: Normal breathing effort, no conversational dyspnea. Extremities: No edema.  Neurologic: No gross sensory or motor deficits. No tremors or fasciculations noted.    Diagnostic Data Reviewed:  BMET    Component Value Date/Time   NA 141 07/25/2023 1106   K 4.4 07/25/2023 1106   CL 104 07/25/2023 1106   CO2 23 07/25/2023 1106   GLUCOSE 101 (H) 07/25/2023 1106   GLUCOSE 99 03/15/2023 0913   BUN 13 07/25/2023 1106   CREATININE 0.80 07/25/2023 1106   CREATININE 0.87 03/15/2023 0913   CALCIUM  9.7 07/25/2023 1106   GFRNONAA 75 10/04/2017 0916   GFRAA 86 10/04/2017 0916   Lab Results  Component Value Date  HGBA1C 6.0 (H) 07/25/2023   Lab Results  Component Value Date   INSULIN  15.1 07/25/2023   Lab Results  Component Value Date   TSH 2.950 07/25/2023   CBC    Component Value Date/Time   WBC 5.6 03/15/2023 0913   RBC 4.02 03/15/2023 0913   HGB 11.8 03/15/2023 0913   HCT 37.0 03/15/2023 0913   PLT 297 03/15/2023 0913   MCV 92.0 03/15/2023 0913   MCH 29.4 03/15/2023 0913   MCHC 31.9 (L) 03/15/2023 0913   RDW 13.0 03/15/2023 0913   Iron  Studies No  results found for: IRON , TIBC, FERRITIN, IRONPCTSAT Lipid Panel     Component Value Date/Time   CHOL 193 03/15/2023 0913   TRIG 82 03/15/2023 0913   HDL 60 03/15/2023 0913   CHOLHDL 3.2 03/15/2023 0913   VLDL 14 03/23/2016 0955   LDLCALC 115 (H) 03/15/2023 0913   Hepatic Function Panel     Component Value Date/Time   PROT 7.2 07/25/2023 1106   ALBUMIN 4.3 07/25/2023 1106   AST 12 07/25/2023 1106   ALT 12 07/25/2023 1106   ALKPHOS 83 07/25/2023 1106   BILITOT 0.3 07/25/2023 1106      Component Value Date/Time   TSH 2.950 07/25/2023 1106   Nutritional Lab Results  Component Value Date   VD25OH 47.9 07/25/2023    Medications: Outpatient Encounter Medications as of 12/17/2023  Medication Sig   acetaminophen  (TYLENOL ) 500 MG tablet Take 500 mg by mouth every 6 (six) hours as needed. prn   COD LIVER OIL PO Take 1 capsule by mouth daily.   Cyanocobalamin  (VITAMIN B 12 PO) Take by mouth.   elvitegravir-cobicistat-emtricitabine-tenofovir (GENVOYA ) 150-150-200-10 MG TABS tablet Take 1 tablet by mouth daily with breakfast.   Ferrous Sulfate  (IRON ) 325 (65 Fe) MG TABS Take by mouth. Unknown dosage   gabapentin  (NEURONTIN ) 300 MG capsule Take 1 capsule (300 mg total) by mouth 2 (two) times daily.   Multiple Vitamin (MULTIVITAMIN) tablet Take 1 tablet by mouth daily.   Omega-3 Fatty Acids  (OMEGA 3 500 PO) Take by mouth.   No facility-administered encounter medications on file as of 12/17/2023.     Follow-Up   Return in about 6 weeks (around 01/28/2024) for For Weight Mangement with Dr. Francyne.SABRA She was informed of the importance of frequent follow up visits to maximize her success with intensive lifestyle modifications for her multiple health conditions.  Attestation Statement   Reviewed by clinician on day of visit: allergies, medications, problem list, medical history, surgical history, family history, social history, and previous encounter notes.     Lucas Francyne, MD

## 2023-12-17 NOTE — Assessment & Plan Note (Signed)
 Obesity with associated prediabetes and abnormal metabolic rate Significant weight loss of 18 pounds over four months through lifestyle modifications, including increased exercise and dietary changes. Body fat percentage decreased to 42%. Prefers to avoid medications like metformin . Emphasized the importance of consistency in lifestyle changes for sustainable weight loss. Discussed the role of genetics and environment in disease development and the importance of maintaining a healthy lifestyle to mitigate risks. - Continue current lifestyle modifications, including increased exercise and healthy eating. - Provided handout on staying on track during the holidays. - Will repeat A1c test at the next visit. - Discontinue metformin  Anne Joyce is not interested in pharmacoprophylaxis for diabetes prevention.  Continue with lifestyle interventionsPatient has a slower than predicted metabolism.  - IC 1339 vs. calculated 1642. This may contribute to weight gain, chronic fatigue and difficulty losing weight.  Anne Joyce has increased her physical activity  Anne Joyce will continue to work on increasing volume of physical activity to a goal of 240 minutes a week.

## 2024-01-28 ENCOUNTER — Ambulatory Visit (INDEPENDENT_AMBULATORY_CARE_PROVIDER_SITE_OTHER): Admitting: Internal Medicine

## 2024-02-05 ENCOUNTER — Ambulatory Visit (INDEPENDENT_AMBULATORY_CARE_PROVIDER_SITE_OTHER): Admitting: Internal Medicine

## 2024-02-13 ENCOUNTER — Other Ambulatory Visit: Payer: Self-pay

## 2024-02-13 DIAGNOSIS — B20 Human immunodeficiency virus [HIV] disease: Secondary | ICD-10-CM

## 2024-02-13 DIAGNOSIS — Z79899 Other long term (current) drug therapy: Secondary | ICD-10-CM

## 2024-02-13 DIAGNOSIS — Z113 Encounter for screening for infections with a predominantly sexual mode of transmission: Secondary | ICD-10-CM

## 2024-02-14 ENCOUNTER — Ambulatory Visit (INDEPENDENT_AMBULATORY_CARE_PROVIDER_SITE_OTHER): Admitting: Internal Medicine

## 2024-02-14 ENCOUNTER — Encounter (INDEPENDENT_AMBULATORY_CARE_PROVIDER_SITE_OTHER): Payer: Self-pay | Admitting: Internal Medicine

## 2024-02-14 VITALS — BP 136/68 | HR 61 | Temp 98.2°F | Ht 64.0 in | Wt 205.0 lb

## 2024-02-14 DIAGNOSIS — Z6835 Body mass index (BMI) 35.0-35.9, adult: Secondary | ICD-10-CM

## 2024-02-14 DIAGNOSIS — E66812 Obesity, class 2: Secondary | ICD-10-CM | POA: Diagnosis not present

## 2024-02-14 DIAGNOSIS — R948 Abnormal results of function studies of other organs and systems: Secondary | ICD-10-CM

## 2024-02-14 DIAGNOSIS — R7303 Prediabetes: Secondary | ICD-10-CM

## 2024-02-14 NOTE — Assessment & Plan Note (Signed)
" °-   IC 1339 vs. calculated 1642. This may contribute to weight gain, chronic fatigue and difficulty losing weight.  When physically active her weight loss picks up. We discussed strategies to improve metabolic health including strengthening exercising consumption of adequate protein to preserve and build lean muscle mass.  "

## 2024-02-14 NOTE — Assessment & Plan Note (Signed)
 Emphasis on dietary management to prevent progression to diabetes. Discussion on the impact of refined carbohydrates and sugars on blood sugar levels and insulin  response. - Avoid refined carbohydrates and sugars, opting for whole grains and vegetables. - Monitor carbohydrate intake, especially during meals out. - Incorporate healthy fats and proteins to stabilize blood sugar levels. - Has declined pharmacoprophylaxis

## 2024-02-14 NOTE — Progress Notes (Signed)
 "  Office: [phone]  /  Fax: [phone]  Weight Summary and Body Composition Analysis (BIA)  Vitals Temp: 98.2 F (36.8 C) BP: 136/68 Pulse Rate: 61 SpO2: 100 %   Anthropometric Measurements Height: 5' 4 (1.626 m) Weight: 205 lb (93 kg) BMI (Calculated): 35.17 Weight at Last Visit: 197 lb Weight Lost Since Last Visit: 0 lb Weight Gained Since Last Visit: 8 lb Starting Weight: 215 lb Total Weight Loss (lbs): 10 lb (4.536 kg) Peak Weight: 245 lb   Body Composition  Body Fat %: 43.6 % Fat Mass (lbs): 89.8 lbs Muscle Mass (lbs): 110.2 lbs Total Body Water (lbs): 80.4 lbs Visceral Fat Rating : 12    RMR: 1339  Today's Visit #: 6  Starting Date: 07/25/23   Subjective   Chief Complaint: Obesity  Interval History  Discussed the use of AI scribe software for clinical note transcription with the patient, who gave verbal consent to proceed.  History of Present Illness Anne Joyce is a 59 year old female with hyperlipidemia, prediabetes who presents for medical weight management.  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 Her physical activity has been inconsistent, and she has not been to the gym for two weeks but resumed yesterday. She is frustrated with her metabolism, describing it as 'getting on my nerves.'  She has a history of hyperlipidemia, prediabetes, and abnormal metabolism, which she believes contributes to her weight gain. Her metabolism requires regular exercise to prevent weight gain. She reports trying to keep her calorie intake low, around 1000 calories, to help with weight loss.   In terms of diet, she eats healthy most of the time, including protein shakes and eggs for breakfast, but admits to occasional snacking, especially when traveling or busy. She prefers certain sauces like ketchup  and duck sauce and acknowledges the need to be mindful of portion sizes and calorie content.     Challenges affecting patient progress: recent lapse in weight management plan due to work, travel, illness or family circumstances.    Pharmacotherapy for weight management: She is currently taking no anti-obesity medication and patient has declined pharmacotherapy in past.   Assessment and Plan   Treatment Plan For Obesity:  Recommended Dietary Goals  Anne Joyce is currently in the action stage of change. As such, her goal is to continue weight management plan. She has agreed to: follow a balanced (30%/40%/30%), whole foods-based, reduced-calorie meal plan (RCNP) targeting 1000 kcal per day, incorporate prepackaged healthy meals for convenience, incorporate 1-2 meal replacements a day for convenience , and continue current reduced-calorie meal plan  Behavioral Health and Counseling  We discussed the following behavioral modification strategies today: increasing lean protein intake to established goals, decreasing simple carbohydrates , increasing vegetables, increasing lower glycemic fruits, increasing fiber rich foods, avoiding skipping meals, increasing water intake , and work on meal planning and preparation.  Additional education and resources provided today: Handout on staying on track while traveling  Recommended Physical Activity Goals  Anne Joyce has been advised to work up to 150 minutes of moderate intensity aerobic activity a week and strengthening exercises 2-3 times per week for cardiovascular health, weight loss maintenance and preservation of muscle mass.  She has agreed to :  Think about enjoyable ways to increase daily physical activity and overcoming barriers to exercise, Increase physical activity in their day  and reduce sedentary time (increase NEAT)., Increase volume of physical activity to a goal of 240 minutes a week, and Combine aerobic and strengthening exercises for  efficiency and improved cardiometabolic health.  Medical Interventions and Pharmacotherapy  We discussed various medication options to help Anne Joyce with her weight loss efforts and we both agreed to : Continue with current nutritional and behavioral strategies and Has declined pharmacotherapy  Associated Conditions Impacted by Obesity Treatment  Assessment & Plan Prediabetes Emphasis on dietary management to prevent progression to diabetes. Discussion on the impact of refined carbohydrates and sugars on blood sugar levels and insulin  response. - Avoid refined carbohydrates and sugars, opting for whole grains and vegetables. - Monitor carbohydrate intake, especially during meals out. - Incorporate healthy fats and proteins to stabilize blood sugar levels. - Has declined pharmacoprophylaxis Abnormal metabolism  - IC 1339 vs. calculated 1642. This may contribute to weight gain, chronic fatigue and difficulty losing weight.  When physically active her weight loss picks up. We discussed strategies to improve metabolic health including strengthening exercising consumption of adequate protein to preserve and build lean muscle mass.  Class 2 severe obesity with serious comorbidity and body mass index (BMI) of 35.0 to 35.9 in adult, unspecified obesity type Weight gain of 8 pounds since last visit, likely due to decreased physical activity and increased snacking. Metabolic challenges noted, with a slower metabolism requiring careful caloric management. Recent weight gain not considered significant, but she is concerned about weight fluctuations. Emphasis on maintaining a calorie deficit to promote fat loss and the importance of protein intake to support metabolism and muscle building. - Target a daily caloric intake of 1000 calories. - Incorporate protein-rich meals at each meal to suppress hunger and support metabolism. - Utilize meal replacements such as protein shakes or frozen meals for  convenience. - Focus on whole foods, including vegetables and fruits, to fill up without excess calories. - Plan for travel by choosing restaurants with healthy options and maintaining physical activity. - Encouraged regular physical activity, including walking and gym workouts, to support metabolism.    Assessment and Plan      Objective   Physical Exam:  Blood pressure 136/68, pulse 61, temperature 98.2 F (36.8 C), height 5' 4 (1.626 m), weight 205 lb (93 kg), last menstrual period 07/24/2012, SpO2 100%. Body mass index is 35.19 kg/m.  General: She is overweight, cooperative, alert, well developed, and in no acute distress. PSYCH: Has normal mood, affect and thought process.   HEENT: EOMI, sclerae are anicteric. Lungs: Normal breathing effort, no conversational dyspnea. Extremities: No edema.  Neurologic: No gross sensory or motor deficits. No tremors or fasciculations noted.    Diagnostic Data Reviewed:  BMET    Component Value Date/Time   NA 141 07/25/2023 1106   K 4.4 07/25/2023 1106   CL 104 07/25/2023 1106   CO2 23 07/25/2023 1106   GLUCOSE 101 (H) 07/25/2023 1106   GLUCOSE 99 03/15/2023 0913   BUN 13 07/25/2023 1106   CREATININE 0.80 07/25/2023 1106   CREATININE 0.87 03/15/2023 0913   CALCIUM  9.7 07/25/2023 1106   GFRNONAA 75 10/04/2017 0916   GFRAA 86 10/04/2017 0916   Lab Results  Component Value Date   HGBA1C 6.0 (H) 07/25/2023   Lab Results  Component Value Date   INSULIN  15.1 07/25/2023   Lab Results  Component Value Date   TSH 2.950 07/25/2023   CBC    Component Value Date/Time   WBC 5.6 03/15/2023 0913   RBC  4.02 03/15/2023 0913   HGB 11.8 03/15/2023 0913   HCT 37.0 03/15/2023 0913   PLT 297 03/15/2023 0913   MCV 92.0 03/15/2023 0913   MCH 29.4 03/15/2023 0913   MCHC 31.9 (L) 03/15/2023 0913   RDW 13.0 03/15/2023 0913   Iron  Studies No results found for: IRON , TIBC, FERRITIN, IRONPCTSAT Lipid Panel     Component Value  Date/Time   CHOL 193 03/15/2023 0913   TRIG 82 03/15/2023 0913   HDL 60 03/15/2023 0913   CHOLHDL 3.2 03/15/2023 0913   VLDL 14 03/23/2016 0955   LDLCALC 115 (H) 03/15/2023 0913   Hepatic Function Panel     Component Value Date/Time   PROT 7.2 07/25/2023 1106   ALBUMIN 4.3 07/25/2023 1106   AST 12 07/25/2023 1106   ALT 12 07/25/2023 1106   ALKPHOS 83 07/25/2023 1106   BILITOT 0.3 07/25/2023 1106      Component Value Date/Time   TSH 2.950 07/25/2023 1106   Nutritional Lab Results  Component Value Date   VD25OH 47.9 07/25/2023    Medications: Outpatient Encounter Medications as of 02/14/2024  Medication Sig   acetaminophen  (TYLENOL ) 500 MG tablet Take 500 mg by mouth every 6 (six) hours as needed. prn   COD LIVER OIL PO Take 1 capsule by mouth daily.   Cyanocobalamin  (VITAMIN B 12 PO) Take by mouth.   elvitegravir-cobicistat-emtricitabine-tenofovir (GENVOYA ) 150-150-200-10 MG TABS tablet Take 1 tablet by mouth daily with breakfast.   Ferrous Sulfate  (IRON ) 325 (65 Fe) MG TABS Take by mouth. Unknown dosage   gabapentin  (NEURONTIN ) 300 MG capsule Take 1 capsule (300 mg total) by mouth 2 (two) times daily.   Multiple Vitamin (MULTIVITAMIN) tablet Take 1 tablet by mouth daily.   Omega-3 Fatty Acids  (OMEGA 3 500 PO) Take by mouth.   No facility-administered encounter medications on file as of 02/14/2024.     Follow-Up   No follow-ups on file.SABRA She was informed of the importance of frequent follow up visits to maximize her success with intensive lifestyle modifications for her multiple health conditions.  Attestation Statement   Reviewed by clinician on day of visit: allergies, medications, problem list, medical history, surgical history, family history, social history, and previous encounter notes.     Lucas Parker, MD  "

## 2024-02-14 NOTE — Assessment & Plan Note (Signed)
 Weight gain of 8 pounds since last visit, likely due to decreased physical activity and increased snacking. Metabolic challenges noted, with a slower metabolism requiring careful caloric management. Recent weight gain not considered significant, but she is concerned about weight fluctuations. Emphasis on maintaining a calorie deficit to promote fat loss and the importance of protein intake to support metabolism and muscle building. - Target a daily caloric intake of 1000 calories. - Incorporate protein-rich meals at each meal to suppress hunger and support metabolism. - Utilize meal replacements such as protein shakes or frozen meals for convenience. - Focus on whole foods, including vegetables and fruits, to fill up without excess calories. - Plan for travel by choosing restaurants with healthy options and maintaining physical activity. - Encouraged regular physical activity, including walking and gym workouts, to support metabolism.

## 2024-02-19 ENCOUNTER — Other Ambulatory Visit: Payer: Self-pay

## 2024-03-04 ENCOUNTER — Encounter: Admitting: Infectious Diseases

## 2024-03-04 ENCOUNTER — Encounter: Payer: Self-pay | Admitting: Internal Medicine

## 2024-03-17 ENCOUNTER — Ambulatory Visit (INDEPENDENT_AMBULATORY_CARE_PROVIDER_SITE_OTHER): Admitting: Internal Medicine
